# Patient Record
Sex: Male | Born: 1960 | Race: White | Hispanic: No | Marital: Married | Smoking: Current every day smoker
Health system: Southern US, Community
[De-identification: ages and names within clinical notes are randomized; demographics above are authoritative.]

## PROBLEM LIST (undated history)

## (undated) DIAGNOSIS — L309 Dermatitis, unspecified: Secondary | ICD-10-CM

## (undated) DIAGNOSIS — I10 Essential (primary) hypertension: Secondary | ICD-10-CM

## (undated) DIAGNOSIS — J438 Other emphysema: Secondary | ICD-10-CM

## (undated) HISTORY — DX: Essential (primary) hypertension: I10

## (undated) HISTORY — PX: KNEE SURGERY: SHX244

---

## 2013-05-21 ENCOUNTER — Ambulatory Visit: Payer: Self-pay | Admitting: Family Medicine

## 2014-12-23 ENCOUNTER — Encounter: Payer: Self-pay | Admitting: Emergency Medicine

## 2014-12-23 ENCOUNTER — Ambulatory Visit
Admission: EM | Admit: 2014-12-23 | Discharge: 2014-12-23 | Disposition: A | Discharge status: Home / Self Care | Payer: Self-pay | Attending: Family Medicine | Admitting: Family Medicine

## 2014-12-23 DIAGNOSIS — M791 Myalgia, unspecified site: Secondary | ICD-10-CM

## 2014-12-23 DIAGNOSIS — B349 Viral infection, unspecified: Secondary | ICD-10-CM

## 2014-12-23 DIAGNOSIS — F17210 Nicotine dependence, cigarettes, uncomplicated: Secondary | ICD-10-CM | POA: Insufficient documentation

## 2014-12-23 LAB — CBC WITH DIFFERENTIAL/PLATELET
Basophils Absolute: 0.2 10*3/uL — ABNORMAL HIGH (ref 0–0.1)
Basophils Relative: 1 %
EOS ABS: 0.2 10*3/uL (ref 0–0.7)
Eosinophils Relative: 1 %
HCT: 47.5 % (ref 40.0–52.0)
HEMOGLOBIN: 16.2 g/dL (ref 13.0–18.0)
LYMPHS ABS: 1.7 10*3/uL (ref 1.0–3.6)
Lymphocytes Relative: 15 %
MCH: 30 pg (ref 26.0–34.0)
MCHC: 34.1 g/dL (ref 32.0–36.0)
MCV: 87.9 fL (ref 80.0–100.0)
Monocytes Absolute: 1.4 10*3/uL — ABNORMAL HIGH (ref 0.2–1.0)
Monocytes Relative: 12 %
NEUTROS PCT: 71 %
Neutro Abs: 8 10*3/uL — ABNORMAL HIGH (ref 1.4–6.5)
Platelets: 250 10*3/uL (ref 150–440)
RBC: 5.4 MIL/uL (ref 4.40–5.90)
RDW: 14.7 % — AB (ref 11.5–14.5)
WBC: 11.3 10*3/uL — ABNORMAL HIGH (ref 3.8–10.6)

## 2014-12-23 LAB — URINALYSIS COMPLETE WITH MICROSCOPIC (ARMC ONLY)
Bacteria, UA: NONE SEEN — AB
Glucose, UA: NEGATIVE mg/dL
Ketones, ur: NEGATIVE mg/dL
Leukocytes, UA: NEGATIVE
Nitrite: NEGATIVE
PH: 6 (ref 5.0–8.0)
PROTEIN: 100 mg/dL — AB
SPECIFIC GRAVITY, URINE: 1.025 (ref 1.005–1.030)
SQUAMOUS EPITHELIAL / LPF: NONE SEEN — AB

## 2014-12-23 LAB — COMPREHENSIVE METABOLIC PANEL
ALBUMIN: 4 g/dL (ref 3.5–5.0)
ALT: 13 U/L — ABNORMAL LOW (ref 17–63)
ANION GAP: 10 (ref 5–15)
AST: 20 U/L (ref 15–41)
Alkaline Phosphatase: 82 U/L (ref 38–126)
BUN: 11 mg/dL (ref 6–20)
CHLORIDE: 99 mmol/L — AB (ref 101–111)
CO2: 29 mmol/L (ref 22–32)
CREATININE: 1.01 mg/dL (ref 0.61–1.24)
Calcium: 9.4 mg/dL (ref 8.9–10.3)
GFR calc Af Amer: >60 mL/min (ref >60)
GFR calc non Af Amer: >60 mL/min (ref >60)
GLUCOSE: 119 mg/dL — AB (ref 65–99)
Potassium: 3.6 mmol/L (ref 3.5–5.1)
Sodium: 138 mmol/L (ref 135–145)
Total Bilirubin: 0.4 mg/dL (ref 0.3–1.2)
Total Protein: 8 g/dL (ref 6.5–8.1)

## 2014-12-23 LAB — CK: Total CK: 94 U/L (ref 49–397)

## 2014-12-23 NOTE — Discharge Instructions (Signed)

## 2014-12-23 NOTE — ED Provider Notes (Signed)
CSN: [id number]     Arrival date & time 12/23/14  1320 History   None    Chief Complaint  Patient presents with  . Cough   (Consider location/radiation/quality/duration/timing/severity/associated sxs/prior Treatment) HPI 54 year old male presents for evaluation of severe body aches, chills, urinary frequency, burning with urination. His symptoms first started 4 days ago with acute onset of chills and weakness. He had an episode of near syncope that resolved with rest. Since then he has had body aches all over. He has not been drinking enough water he doesn't think, last night he drank a large amount of water and today he feels slightly better. He denies any flank or abdominal pain, nausea, vomiting, chest pain, shortness of breath, leg swelling. He has never had anything like this in the past. Denies any measured fever. Says today he does actually feel like he is starting to get better but he just was to be evaluated to make sure everything is fine. He denies any rash. He is a current smoker, smokes about half pack a day.  History reviewed. No pertinent past medical history. Past Surgical History  Procedure Laterality Date  . Knee surgery Bilateral    History reviewed. No pertinent family history. History  Substance Use Topics  . Smoking status: Current Every Day Smoker -- 0.50 packs/day    Types: Cigarettes  . Smokeless tobacco: Never Used  . Alcohol Use: No    Review of Systems  Constitutional: Positive for chills and fatigue. Negative for fever.  Respiratory: Positive for cough (very mild). Negative for shortness of breath.   Cardiovascular: Negative for chest pain.  Gastrointestinal: Negative for nausea, vomiting, abdominal pain and diarrhea.  Genitourinary: Positive for dysuria, urgency and frequency. Negative for hematuria and discharge.  Musculoskeletal: Positive for myalgias.  Neurological: Positive for weakness.  All other systems reviewed and are negative.   Allergies   Penicillins  Home Medications   Prior to Admission medications   Not on File   BP 156/92 mmHg  Pulse 100  Temp(Src) 98.4 F (36.9 C) (Tympanic)  Resp 16  Ht 5\' 9"  (1.753 m)  Wt 145 lb (65.772 kg)  BMI 21.40 kg/m2  SpO2 98% Physical Exam  Constitutional: He is oriented to person, place, and time. He appears well-developed and well-nourished. No distress.  HENT:  Head: Normocephalic and atraumatic.  Right Ear: Tympanic membrane, external ear and ear canal normal.  Left Ear: Tympanic membrane, external ear and ear canal normal.  Nose: Nose normal. Right sinus exhibits no maxillary sinus tenderness and no frontal sinus tenderness. Left sinus exhibits no maxillary sinus tenderness and no frontal sinus tenderness.  Mouth/Throat: Uvula is midline, oropharynx is clear and moist and mucous membranes are normal. No oropharyngeal exudate or posterior oropharyngeal erythema.  Eyes: Conjunctivae are normal.  Neck: Normal range of motion and full passive range of motion without pain. Neck supple.  Cardiovascular: Normal rate, regular rhythm, normal heart sounds and intact distal pulses.   Pulmonary/Chest: Effort normal and breath sounds normal. No respiratory distress.  Abdominal: Soft. Normal appearance and bowel sounds are normal. He exhibits no mass. There is no hepatosplenomegaly. There is no tenderness. There is no rigidity, no rebound, no guarding, no CVA tenderness, no tenderness at McBurney's point and negative Murphy's sign.  Lymphadenopathy:    He has no cervical adenopathy.  Neurological: He is alert and oriented to person, place, and time. Coordination normal.  Skin: Skin is warm and dry. No rash noted. He is not diaphoretic.  Psychiatric: He has a normal mood and affect. Judgment normal.  Nursing note and vitals reviewed.   ED Course  Procedures (including critical care time) Labs Review Labs Reviewed  URINALYSIS COMPLETEWITH MICROSCOPIC ([facility] ONLY) - Abnormal; Notable for  the following:    Color, Urine AMBER (*)    Bilirubin Urine 1+ (*)    Hgb urine dipstick 2+ (*)    Protein, ur 100 (*)    Bacteria, UA NONE SEEN (*)    Squamous Epithelial / LPF NONE SEEN (*)    All other components within normal limits  CBC WITH DIFFERENTIAL/PLATELET - Abnormal; Notable for the following:    WBC 11.3 (*)    RDW 14.7 (*)    Neutro Abs 8.0 (*)    Monocytes Absolute 1.4 (*)    Basophils Absolute 0.2 (*)    All other components within normal limits  COMPREHENSIVE METABOLIC PANEL - Abnormal; Notable for the following:    Chloride 99 (*)    Glucose, Bld 119 (*)    ALT 13 (*)    All other components within normal limits  CK    Imaging Review No results found.   MDM   1. Viral syndrome   2. Myalgia    Mild leukocytosis, rest of labs unremarkable. CK is not elevated so no concern for rhabdo. He has some blood in the urine, he will follow-up with his primary care provider about this. I believe he most likely has a viral infection, especially considering that he is improving today. Advised him to continue symptomatic management and follow-up again if any worsening.      Graylon Good, PA-C 12/23/14 1617  Graylon Good, PA-C 12/23/14 [phone]

## 2014-12-23 NOTE — ED Notes (Signed)
Patient c/o cough and chest congestion since Saturday.  Patient reports chills.

## 2014-12-31 ENCOUNTER — Ambulatory Visit
Admission: EM | Admit: 2014-12-31 | Discharge: 2014-12-31 | Disposition: A | Discharge status: Home / Self Care | Payer: Self-pay | Attending: Family Medicine | Admitting: Family Medicine

## 2014-12-31 ENCOUNTER — Ambulatory Visit: Payer: Self-pay

## 2014-12-31 DIAGNOSIS — L309 Dermatitis, unspecified: Secondary | ICD-10-CM | POA: Insufficient documentation

## 2014-12-31 DIAGNOSIS — F17210 Nicotine dependence, cigarettes, uncomplicated: Secondary | ICD-10-CM | POA: Insufficient documentation

## 2014-12-31 DIAGNOSIS — J189 Pneumonia, unspecified organism: Secondary | ICD-10-CM | POA: Insufficient documentation

## 2014-12-31 LAB — URINALYSIS COMPLETE WITH MICROSCOPIC (ARMC ONLY)
BACTERIA UA: NONE SEEN — AB
BILIRUBIN URINE: NEGATIVE
GLUCOSE, UA: NEGATIVE mg/dL
Ketones, ur: NEGATIVE mg/dL
Leukocytes, UA: NEGATIVE
Nitrite: NEGATIVE
PH: 6.5 (ref 5.0–8.0)
PROTEIN: NEGATIVE mg/dL
Specific Gravity, Urine: 1.02 (ref 1.005–1.030)
Squamous Epithelial / LPF: NONE SEEN — AB

## 2014-12-31 LAB — RAPID INFLUENZA A&B ANTIGENS
Influenza A (ARMC): NOT DETECTED
Influenza B (ARMC): NOT DETECTED

## 2014-12-31 LAB — RAPID STREP SCREEN (MED CTR MEBANE ONLY): Streptococcus, Group A Screen (Direct): NEGATIVE

## 2014-12-31 MED ORDER — AZITHROMYCIN 250 MG PO TABS: 250.0000 mg | ORAL_TABLET | Freq: Every day | ORAL | Status: DC

## 2014-12-31 MED ORDER — IPRATROPIUM-ALBUTEROL 0.5-2.5 (3) MG/3ML IN SOLN
3.0000 mL | Freq: Once | RESPIRATORY_TRACT | Status: AC
  Administered 2014-12-31: 3 mL via RESPIRATORY_TRACT

## 2014-12-31 NOTE — ED Provider Notes (Addendum)
CSN: [id number]     Arrival date & time 12/31/14  1153 History   First MD Initiated Contact with Patient 12/31/14 1229     Chief Complaint  Patient presents with  . Nasal Congestion  . 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  Stayed in bed until seen by PA Baker.  Tried to go back to work 6 Jun but still having fatigue carrying tools to load truck, headache, chest congestion, cough, neck/upper back pain/cramps, rash on back "I think from sweating so much"  Cough mucuous has changed from yellow to thick white and clear  Denied sick contacts.  Urinating small frequent amounts tolerating po intake.  Normal blood pressure 120-130s over 60-70s  Patient is a 54 y.o. male presenting with rash. The history is provided by the patient.  Rash Location:  Torso Torso rash location:  Upper back and lower back Quality: itchiness   Quality: not blistering, not bruising, not burning, not draining, not dry, not painful, not peeling, not red, not scaling, not swelling and not weeping   Severity:  Moderate Onset quality:  Sudden Duration:  5 days Timing:  Constant Progression:  Unchanged Chronicity:  New Context: sun exposure   Context: not animal contact, not chemical exposure, not diapers, not eggs, not exposure to similar rash, not food, not hot tub use, not insect bite/sting, not medications, not new detergent/soap, not nuts, not plant contact, not pollen, not pregnancy and not sick contacts   Relieved by:  Nothing Worsened by:  Heat and moisture Ineffective treatments:  None tried Associated symptoms: fatigue, headaches, myalgias and URI   Associated symptoms: no abdominal pain, no diarrhea, no fever, no hoarse voice, no induration, no joint pain, no nausea, no periorbital  edema, no shortness of breath, no sore throat, no throat swelling, no tongue swelling, not vomiting and not wheezing   Fatigue:    Severity:  Moderate   Duration:  10 days   Timing:  Constant   Progression:  Improving Headaches:    Severity:  Moderate   Onset quality:  Gradual   Duration:  5 days   Timing:  Constant   Progression:  Unchanged   Chronicity:  New Myalgias:    Location:  Generalized   Quality:  Aching   Severity:  Moderate   Onset quality:  Sudden   Duration:  10 days   Timing:  Constant   Progression:  Improving   History reviewed. No pertinent past medical history. Past Surgical History  Procedure Laterality Date  . Knee surgery Bilateral   . Knee surgery      bilateral   Family History  Problem Relation Age of Onset  . Parkinson's disease Mother   . Cancer Father   . Cancer Sister   . Lupus Sister    History  Substance Use Topics  . Smoking status: Current Every Day Smoker -- 0.50 packs/day    Types: Cigarettes  . Smokeless tobacco: Never Used  . Alcohol Use: No    Review of Systems  Constitutional: Positive for chills, diaphoresis, activity change, appetite change and fatigue. Negative for fever.  HENT: Negative for congestion, dental problem, drooling, ear discharge, ear pain, facial swelling, hearing loss, hoarse voice, mouth sores, nosebleeds, postnasal drip, rhinorrhea, sinus pressure, sneezing, sore throat, tinnitus, trouble swallowing and voice change.   Eyes: Negative for  photophobia, pain, discharge, redness, itching and visual disturbance.  Respiratory: Positive for cough. Negative for apnea, choking, chest tightness, shortness of breath, wheezing and stridor.   Cardiovascular: Negative for chest pain, palpitations and leg swelling.  Gastrointestinal: Negative for nausea, vomiting, abdominal pain, diarrhea, constipation, blood in stool and abdominal distention.  Endocrine: Negative for cold intolerance and heat intolerance.   Genitourinary: Positive for frequency and decreased urine volume. Negative for dysuria, urgency, hematuria, flank pain, scrotal swelling, enuresis, difficulty urinating, genital sores and testicular pain.  Musculoskeletal: Positive for myalgias, back pain and neck pain. Negative for joint swelling, arthralgias, gait problem and neck stiffness.  Skin: Positive for rash. Negative for color change, pallor and wound.  Allergic/Immunologic: Negative for environmental allergies and food allergies.  Neurological: Positive for weakness and headaches. Negative for dizziness, tremors, seizures, syncope, facial asymmetry, speech difficulty, light-headedness and numbness.  Hematological: Negative for adenopathy. Does not bruise/bleed easily.  Psychiatric/Behavioral: Negative for behavioral problems, confusion, sleep disturbance and agitation.    Allergies  Codeine and Penicillins  Home Medications   Prior to Admission medications   Medication Sig Start Date End Date Taking? Authorizing Provider  azithromycin (ZITHROMAX) 250 MG tablet Take 1 tablet (250 mg total) by mouth daily. Take first 2 tablets together, then 1 every day until finished. 12/31/14   Jarold Song Betancourt, NP   BP 155/102 mmHg  Pulse 84  Temp(Src) 98 F (36.7 C) (Oral)  Resp 18  Ht 5\' 9"  (1.753 m)  Wt 140 lb (63.504 kg)  BMI 20.67 kg/m2  SpO2 98% Physical Exam  Constitutional: He is oriented to person, place, and time. He appears well-developed and well-nourished. He is active. No distress.  HENT:  Head: Normocephalic and atraumatic.  Right Ear: Hearing, external ear and ear canal normal. A middle ear effusion is present.  Left Ear: Hearing, external ear and ear canal normal. A middle ear effusion is present.  Nose: Nose normal. No mucosal edema, rhinorrhea, nose lacerations, sinus tenderness, nasal deformity, septal deviation or nasal septal hematoma. No epistaxis.  No foreign bodies. Right sinus exhibits no maxillary sinus  tenderness and no frontal sinus tenderness. Left sinus exhibits no maxillary sinus tenderness and no frontal sinus tenderness.  Mouth/Throat: Uvula is midline and mucous membranes are normal. He does not have dentures. No oral lesions. No trismus in the jaw. Normal dentition. No dental abscesses, uvula swelling, lacerations or dental caries. Posterior oropharyngeal edema and posterior oropharyngeal erythema present. No oropharyngeal exudate or tonsillar abscesses.  Oropharynx erythematous tonsils 1+/4 bilateral erythema/edema; bilateral TMs with effusion clear  Eyes: Conjunctivae and EOM are normal. Pupils are equal, round, and reactive to light. Right eye exhibits no discharge. Left eye exhibits no discharge. No scleral icterus.  Neck: Normal range of motion. Neck supple. No JVD present. No tracheal deviation present. No thyromegaly present.  Cardiovascular: Normal rate, regular rhythm, normal heart sounds and intact distal pulses.  Exam reveals no gallop and no friction rub.   No murmur heard. Pulmonary/Chest: Effort normal and breath sounds normal. No stridor. No respiratory distress. He has no wheezes. He has no rales. He exhibits no tenderness.  Abdominal: Soft. He exhibits no distension.  Musculoskeletal: Normal range of motion. He exhibits no edema.       Cervical back: He exhibits tenderness, pain and spasm. He exhibits normal range of motion, no bony tenderness, no swelling, no edema, no deformity, no laceration and normal pulse.  Bilateral trapezius muscles TTP tense  Lymphadenopathy:    He has  no cervical adenopathy.  Neurological: He is alert and oriented to person, place, and time. He has normal reflexes. Coordination normal.  Skin: Skin is warm and dry. Rash noted. He is not diaphoretic. No erythema. No pallor.  Psychiatric: He has a normal mood and affect. His behavior is normal. Judgment and thought content normal.  Nursing note and vitals reviewed.   ED Course  Procedures  (including critical care time) Labs Review Labs Reviewed  URINALYSIS COMPLETEWITH MICROSCOPIC ([facility] ONLY) - Abnormal; Notable for the following:    Hgb urine dipstick 1+ (*)    Bacteria, UA NONE SEEN (*)    Squamous Epithelial / LPF NONE SEEN (*)    All other components within normal limits  RAPID STREP SCREEN (NOT AT [facility])  INFLUENZA A&B ANTIGENS ([facility] ONLY)  CULTURE, GROUP A STREP ([facility] ONLY)    Imaging Review Dg Chest 2 View  12/31/2014   CLINICAL DATA:  54 year old male with productive cough, yellow sputum for 2 weeks. Back pain and weakness. Smoker. Initial encounter.  EXAM: CHEST  2 VIEW  COMPARISON:  None.  FINDINGS: Large lung volumes. Subtle patchy and asymmetric opacity projecting in the superior segment of the right lower lobe (arrows). No superimposed pneumothorax, pulmonary edema, pleural effusion, or consolidation. Normal cardiac size and mediastinal contours. Visualized tracheal air column is within normal limits. No acute osseous abnormality identified.  IMPRESSION: Patchy and indistinct opacity in the superior segment of the right lower lobe. Followup PA and lateral chest X-ray is recommended in 3-4 weeks following trial of antibiotic therapy to ensure resolution and exclude underlying malignancy.   Electronically Signed   By: Odessa Fleming M.D.   On: 12/31/2014 13:22   Patient reported headache improved after duoneb; no change in breathing effort.  Sp02 98% room air. Discussed chest xray results, rapid strep and flu negative.  Urinalysis improved from previous but still protein continue hydration, rest.  Work excuse x 48 hours.  Patient given copy of xray/lab results. Discussed with patient and RN Lynne-Anne to repeat BP prior to discharge.   Patient verbalized understanding of information/instructions, agreed with plan of care and had no further questions.    MDM   1. Community acquired pneumonia   2. Dermatitis   repeat chest xray in 4 weeks for patchy asymetric opacity right  lower lobe.  Already ordered.  Pneumonia community acquired simple, community acquired, may have started as viral (probably respiratory syncytial, parainfluenza, influenza, or adenovirus), but now evidence of patchy opacity right lower lobe.  Azithromycin  po daily x 1 day then  po daily x 4 days.  Stop smoking.   Differential Diagnoses:  Reactive Airway Disease (asthma, allergic aspergillosis eosinophilia), chronic bronchitis, respiratory infection (sinusitis, common cold, pneumonia), congestive heart failure, smoke/irritant exposure, reflux esophagitis, bronchogenic tumor, and/or aspiration syndromes.  I will give   I discussed that approximately 50% of patients with acute bronchitis have a cough that lasts up to three weeks, and 25% for over a month. Tylenol, one to two tablets every four hours as needed for fever or myalgias. Patient instructed to follow up in one week with PCM or sooner if symptoms worsen. Patient verbalized agreement and understanding of treatment plan.  P2:  hand washing and cover cough  Symptomatic therapy suggested benadryl or zyrtec OTC po prn.  Warm to cool water soaks and/or oatmeal baths.  Call or return to clinic as needed if these symptoms worsen or fail to improve as anticipated.  Exitcare handout on dermatitis given  to patient.  Patient verbalized agreement and understanding of treatment plan.   P2:  Avoidance and hand washing.     Barbaraann Barthelina A Betancourt, NP 12/31/14 1355  09 Jan 2015 at 0838 Telephone message left for patient throat culture results negative/normal.  Patient to contact clinic to verify message receipt and if any improvement in symptoms.  Patient still has follow up chest xray pending first week of July.  Barbaraann Barthelina A Betancourt, NP 01/09/15 [phone]  Telephone message left for patient to contact clinic to verify he had completed follow up chest xray s/p pneumonia and if symptoms have resolved or cough continues.  10 Feb 2015 at 1323  Barbaraann Barthelina A Betancourt,  NP 02/10/15 1323

## 2014-12-31 NOTE — Discharge Instructions (Signed)
Pneumonia °Pneumonia is an infection of the lungs.  °CAUSES °Pneumonia may be caused by bacteria or a virus. Usually, these infections are caused by breathing infectious particles into the lungs (respiratory tract). °SIGNS AND SYMPTOMS  °· Cough. °· Fever. °· Chest pain. °· Increased rate of breathing. °· Wheezing. °· Mucus production. °DIAGNOSIS  °If you have the common symptoms of pneumonia, your health care provider will typically confirm the diagnosis with a chest X-ray. The X-ray will show an abnormality in the lung (pulmonary infiltrate) if you have pneumonia. Other tests of your blood, urine, or sputum may be done to find the specific cause of your pneumonia. Your health care provider may also do tests (blood gases or pulse oximetry) to see how well your lungs are working. °TREATMENT  °Some forms of pneumonia may be spread to other people when you cough or sneeze. You may be asked to wear a mask before and during your exam. Pneumonia that is caused by bacteria is treated with antibiotic medicine. Pneumonia that is caused by the influenza virus may be treated with an antiviral medicine. Most other viral infections must run their course. These infections will not respond to antibiotics.  °HOME CARE INSTRUCTIONS  °· Cough suppressants may be used if you are losing too much rest. However, coughing protects you by clearing your lungs. You should avoid using cough suppressants if you can. °· Your health care provider may have prescribed medicine if he or she thinks your pneumonia is caused by bacteria or influenza. Finish your medicine even if you start to feel better. °· Your health care provider may also prescribe an expectorant. This loosens the mucus to be coughed up. °· Take medicines only as directed by your health care provider. °· Do not smoke. Smoking is a common cause of bronchitis and can contribute to pneumonia. If you are a smoker and continue to smoke, your cough may last several weeks after your  pneumonia has cleared. °· A cold steam vaporizer or humidifier in your room or home may help loosen mucus. °· Coughing is often worse at night. Sleeping in a semi-upright position in a recliner or using a couple pillows under your head will help with this. °· Get rest as you feel it is needed. Your body will usually let you know when you need to rest. °PREVENTION °A pneumococcal shot (vaccine) is available to prevent a common bacterial cause of pneumonia. This is usually suggested for: °· People over 65 years old. °· Patients on chemotherapy. °· People with chronic lung problems, such as bronchitis or emphysema. °· People with immune system problems. °If you are over 65 or have a high risk condition, you may receive the pneumococcal vaccine if you have not received it before. In some countries, a routine influenza vaccine is also recommended. This vaccine can help prevent some cases of pneumonia. You may be offered the influenza vaccine as part of your care. °If you smoke, it is time to quit. You may receive instructions on how to stop smoking. Your health care provider can provide medicines and counseling to help you quit. °SEEK MEDICAL CARE IF: °You have a fever. °SEEK IMMEDIATE MEDICAL CARE IF:  °· Your illness becomes worse. This is especially true if you are elderly or weakened from any other disease. °· You cannot control your cough with suppressants and are losing sleep. °· You begin coughing up blood. °· You develop pain which is getting worse or is uncontrolled with medicines. °· Any of the symptoms   which initially brought you in for treatment are getting worse rather than better.  You develop shortness of breath or chest pain. MAKE SURE YOU:   Understand these instructions.  Will watch your condition.  Will get help right away if you are not doing well or get worse. Document Released: 07/11/2005 Document Revised: 11/25/2013 Document Reviewed: 09/30/2010 [facility] Patient Information 2015  [place]. This information is not intended to replace advice given to you by your health care provider. Make sure you discuss any questions you have with your health care provider. Contact Dermatitis Contact dermatitis is a reaction to certain substances that touch the skin. Contact dermatitis can be either irritant contact dermatitis or allergic contact dermatitis. Irritant contact dermatitis does not require previous exposure to the substance for a reaction to occur.Allergic contact dermatitis only occurs if you have been exposed to the substance before. Upon a repeat exposure, your body reacts to the substance.  CAUSES  Many substances can cause contact dermatitis. Irritant dermatitis is most commonly caused by repeated exposure to mildly irritating substances, such as:  Makeup.  Soaps.  Detergents.  Bleaches.  Acids.  Metal salts, such as nickel. Allergic contact dermatitis is most commonly caused by exposure to:  Poisonous plants.  Chemicals (deodorants, shampoos).  Jewelry.  Latex.  Neomycin in triple antibiotic cream.  Preservatives in products, including clothing. SYMPTOMS  The area of skin that is exposed may develop:  Dryness or flaking.  Redness.  Cracks.  Itching.  Pain or a burning sensation.  Blisters. With allergic contact dermatitis, there may also be swelling in areas such as the eyelids, mouth, or genitals.  DIAGNOSIS  Your caregiver can usually tell what the problem is by doing a physical exam. In cases where the cause is uncertain and an allergic contact dermatitis is suspected, a patch skin test may be performed to help determine the cause of your dermatitis. TREATMENT Treatment includes protecting the skin from further contact with the irritating substance by avoiding that substance if possible. Barrier creams, powders, and gloves may be helpful. Your caregiver may also recommend:  Steroid creams or ointments applied 2 times daily. For  best results, soak the rash area in cool water for 20 minutes. Then apply the medicine. Cover the area with a plastic wrap. You can store the steroid cream in the refrigerator for a "chilly" effect on your rash. That may decrease itching. Oral steroid medicines may be needed in more severe cases.  Antibiotics or antibacterial ointments if a skin infection is present.  Antihistamine lotion or an antihistamine taken by mouth to ease itching.  Lubricants to keep moisture in your skin.  Burow's solution to reduce redness and soreness or to dry a weeping rash. Mix one packet or tablet of solution in 2 cups cool water. Dip a clean washcloth in the mixture, wring it out a bit, and put it on the affected area. Leave the cloth in place for 30 minutes. Do this as often as possible throughout the day.  Taking several cornstarch or baking soda baths daily if the area is too large to cover with a washcloth. Harsh chemicals, such as alkalis or acids, can cause skin damage that is like a burn. You should flush your skin for 15 to 20 minutes with cold water after such an exposure. You should also seek immediate medical care after exposure. Bandages (dressings), antibiotics, and pain medicine may be needed for severely irritated skin.  HOME CARE INSTRUCTIONS  Avoid the substance that caused  your reaction.  Keep the area of skin that is affected away from hot water, soap, sunlight, chemicals, acidic substances, or anything else that would irritate your skin.  Do not scratch the rash. Scratching may cause the rash to become infected.  You may take cool baths to help stop the itching.  Only take over-the-counter or prescription medicines as directed by your caregiver.  See your caregiver for follow-up care as directed to make sure your skin is healing properly. SEEK MEDICAL CARE IF:   Your condition is not better after 3 days of treatment.  You seem to be getting worse.  You see signs of infection such as  swelling, tenderness, redness, soreness, or warmth in the affected area.  You have any problems related to your medicines. Document Released: 07/08/2000 Document Revised: 10/03/2011 Document Reviewed: 12/14/2010 [facility] Patient Information 2015 [place], [place]. This information is not intended to replace advice given to you by your health care provider. Make sure you discuss any questions you have with your health care provider.

## 2014-12-31 NOTE — ED Notes (Signed)
Pt seen here last Wednesday. Continues to feel poorly and complains of congestion and bumps on his back.

## 2015-01-03 LAB — CULTURE, GROUP A STREP (THRC)

## 2015-01-06 ENCOUNTER — Ambulatory Visit
Admission: RE | Admit: 2015-01-06 | Discharge: 2015-01-06 | Disposition: A | Discharge status: Home / Self Care | Payer: Self-pay | Source: Ambulatory Visit | Attending: Registered Nurse | Admitting: Registered Nurse

## 2015-02-20 ENCOUNTER — Encounter: Payer: Self-pay | Admitting: Registered Nurse

## 2015-02-20 ENCOUNTER — Telehealth: Payer: Self-pay | Admitting: Registered Nurse

## 2015-02-20 MED ORDER — AZITHROMYCIN 250 MG PO TABS: 250.0000 mg | ORAL_TABLET | Freq: Every day | ORAL | Status: DC

## 2015-02-20 NOTE — Telephone Encounter (Signed)
Patient contacted clinic for refill of azithromycin.  Patient reported his symptoms resolved after taking medications as prescribed for his visit 30 Jan 2015 x 3 weeks but started to have sharp back pain, stiff shoulders and neck and congestion x 2 days.  Denied shortness of breath, wheezing, hemoptysis, fever, chills.  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  Discussed with patient I had left message 10 Feb 2015 for him to contact clinic to follow up abnormal chest xray as radiologist recommended follow up chest xray in 3 weeks after last appt and he had been noncompliant.  Patient reported symptoms had resolved and unable to take off time from work.  He agreed to come in on 5 Aug at 0800 for repeat chest xray and follow up with me.  Patient to follow up sooner if dyspnea, hemoptysis, wheezing.  Encouraged patient to stop smoking. Patient did not want to stop at this time.   Hydrate.  Patient verbalized understanding of information/instructions, agreed with plan of care and had no further questions at this time.

## 2015-02-27 ENCOUNTER — Encounter: Payer: Self-pay | Admitting: Emergency Medicine

## 2015-02-27 ENCOUNTER — Ambulatory Visit: Payer: Self-pay

## 2015-02-27 ENCOUNTER — Ambulatory Visit
Admission: EM | Admit: 2015-02-27 | Discharge: 2015-02-27 | Disposition: A | Discharge status: Home / Self Care | Payer: Self-pay | Attending: Family Medicine | Admitting: Family Medicine

## 2015-02-27 DIAGNOSIS — R053 Chronic cough: Secondary | ICD-10-CM

## 2015-02-27 DIAGNOSIS — R05 Cough: Secondary | ICD-10-CM | POA: Insufficient documentation

## 2015-02-27 DIAGNOSIS — F17210 Nicotine dependence, cigarettes, uncomplicated: Secondary | ICD-10-CM | POA: Insufficient documentation

## 2015-02-27 NOTE — ED Provider Notes (Signed)
CSN: [id number]     Arrival date & time 02/27/15  0826 History   First MD Initiated Contact with Patient 02/27/15 [phone]     Chief Complaint  Patient presents with  . Cough   (Consider location/radiation/quality/duration/timing/severity/associated sxs/prior Treatment) HPI Comments: Caucasian male here for follow up had viral illness possible rhabdomyolysis  31 May seen by PA Baker started 4 days prior.  Was then seen by me 31 Dec 2014 diagnosed with pneumonia and abnormal chest xray treated with azithromycin and symptoms resolved but then returned 29 Jul and retreated with azithromycin.  Patient here for follow up chest xray and re-evaluation.  Reported smoking 15 cigarettes per day versus 1 1/2 PPD previously.  Still having intermittent smokers cough and wheezing per patient his normal now.  Upper back  Pain right has resolved that had started last week  With worsening cough.  Per patient normal blood pressure 120-130s over 60-70s anxious today as sister with renal cell carcinoma and father with aortic aneurysm/dissection/cancer mets renal, gastric, heart, lungs, smoker died 2 years after diagnosis   Patient is a 54 y.o. male presenting with cough. The history is provided by the patient.  Cough Cough characteristics:  Non-productive Severity:  Moderate Onset quality:  Sudden Timing:  Intermittent Progression:  Improving Chronicity:  Recurrent Smoker: yes   Context: animal exposure, exposure to allergens, smoke exposure, upper respiratory infection, weather changes and with activity   Context: not fumes, not occupational exposure and not sick contacts   Worsened by:  Smoking Associated symptoms: wheezing   Associated symptoms: no chest pain, no chills, no diaphoresis, no ear fullness, no ear pain, no eye discharge, no fever, no headaches, no myalgias, no rash, no rhinorrhea, no shortness of breath, no sinus congestion, no sore throat and no weight loss   Wheezing:    Severity:  Mild   Onset  quality:  Gradual   Timing:  Intermittent   Progression:  Unchanged   Chronicity:  Chronic Risk factors: recent infection   Risk factors: no chemical exposure and no recent travel     History reviewed. No pertinent past medical history. Past Surgical History  Procedure Laterality Date  . Knee surgery Bilateral   . Knee surgery      bilateral   Family History  Problem Relation Age of Onset  . Parkinson's disease Mother   . Cancer Father   . Cancer Sister   . Lupus Sister    History  Substance Use Topics  . Smoking status: Current Every Day Smoker -- 0.50 packs/day    Types: Cigarettes  . Smokeless tobacco: Never Used  . Alcohol Use: No    Review of Systems  Constitutional: Negative for fever, chills, weight loss, diaphoresis, activity change and appetite change.  HENT: Negative for congestion, dental problem, drooling, ear discharge, ear pain, facial swelling, hearing loss, mouth sores, nosebleeds, postnasal drip, rhinorrhea, sinus pressure, sneezing, sore throat, tinnitus, trouble swallowing and voice change.   Eyes: Negative for photophobia, pain, discharge, redness, itching and visual disturbance.  Respiratory: Positive for cough and wheezing. Negative for choking, chest tightness, shortness of breath and stridor.   Cardiovascular: Negative for chest pain, palpitations and leg swelling.  Gastrointestinal: Negative for nausea, vomiting, abdominal pain, diarrhea, constipation, blood in stool and abdominal distention.  Endocrine: Negative for cold intolerance and heat intolerance.  Genitourinary: Negative for dysuria, frequency, hematuria, flank pain and difficulty urinating.  Musculoskeletal: Negative for myalgias, back pain, joint swelling, arthralgias, gait problem, neck pain and neck  stiffness.  Skin: Negative for color change, pallor, rash and wound.  Allergic/Immunologic: Negative for environmental allergies and food allergies.  Neurological: Negative for dizziness,  tremors, seizures, syncope, facial asymmetry, speech difficulty, weakness, light-headedness, numbness and headaches.  Hematological: Negative for adenopathy. Does not bruise/bleed easily.  Psychiatric/Behavioral: Negative for behavioral problems, confusion, sleep disturbance and agitation. The patient is nervous/anxious.     Allergies  Codeine and Penicillins  Home Medications   Prior to Admission medications   Medication Sig Start Date End Date Taking? Authorizing Provider  azithromycin (ZITHROMAX) 250 MG tablet Take 1 tablet (250 mg total) by mouth daily. Take first 2 tablets together, then 1 every day until finished. Patient not taking: Reported on 02/20/2015 12/31/14   Jarold Song Dannika Hilgeman, NP   BP 177/95 mmHg  Pulse 70  Temp(Src) 96.7 F (35.9 C) (Tympanic)  Resp 16  Ht  (1.753 m)  Wt 140 lb (63.504 kg)  BMI 20.67 kg/m2  SpO2 99% Physical Exam  Constitutional: He is oriented to person, place, and time. Vital signs are normal. He appears well-developed and well-nourished. No distress.  HENT:  Head: Normocephalic and atraumatic.  Right Ear: Hearing, external ear and ear canal normal. A middle ear effusion is present.  Left Ear: Hearing, external ear and ear canal normal. A middle ear effusion is present.  Nose: Nose normal. No mucosal edema or rhinorrhea. Right sinus exhibits no maxillary sinus tenderness and no frontal sinus tenderness. Left sinus exhibits no maxillary sinus tenderness and no frontal sinus tenderness.  Mouth/Throat: Uvula is midline and mucous membranes are normal. Mucous membranes are not pale, not dry and not cyanotic. He does not have dentures. No oral lesions. No trismus in the jaw. Normal dentition. No dental abscesses, uvula swelling, lacerations or dental caries. Posterior oropharyngeal edema and posterior oropharyngeal erythema present. No oropharyngeal exudate or tonsillar abscesses.  Cobblestoning posterior pharynx; bilateral TMs with air fluid level  clear  Eyes: Conjunctivae, EOM and lids are normal. Pupils are equal, round, and reactive to light. Right eye exhibits no discharge. Left eye exhibits no discharge. No scleral icterus.  Neck: Trachea normal and normal range of motion. Neck supple. No tracheal deviation present.  Cardiovascular: Normal rate, regular rhythm, normal heart sounds and intact distal pulses.  Exam reveals no gallop and no friction rub.   No murmur heard. Pulmonary/Chest: Effort normal and breath sounds normal. No accessory muscle usage or stridor. No respiratory distress. He has no decreased breath sounds. He has no wheezes. He has no rhonchi. He has no rales.  egophany normal all fields  Abdominal: Soft. He exhibits no distension.  Musculoskeletal: Normal range of motion. He exhibits no edema or tenderness.  Lymphadenopathy:    He has no cervical adenopathy.  Neurological: He is alert and oriented to person, place, and time. He exhibits normal muscle tone. Coordination normal.  Skin: Skin is warm, dry and intact. No rash noted. He is not diaphoretic. No erythema. No pallor.  Psychiatric: He has a normal mood and affect. His speech is normal and behavior is normal. Judgment and thought content normal. Cognition and memory are normal.  Nursing note and vitals reviewed.   ED Course  Procedures (including critical care time) Labs Review Labs Reviewed - No data to display  Imaging Review Dg Chest 2 View  02/27/2015   CLINICAL DATA:  Abnormal prior chest radiograph  EXAM: CHEST  2 VIEW  COMPARISON:  December 31, 2014  FINDINGS: Ill-defined opacity is again noted in the  right lower lobe region, similar to the appearance on prior study. Elsewhere lungs are clear. The heart size and pulmonary vascularity are normal. No adenopathy. No bone lesions.  IMPRESSION: Stable ill-defined opacity right lower lobe, not felt to be appreciably changed compared to the prior study. Differential considerations include persistent focus of  pneumonia or atypical ground-glass type appearing neoplasm. Given concern for potential neoplasm, chest CT, ideally with intravenous contrast, is advised to further evaluate. Elsewhere lungs clear. No adenopathy appreciable.  These results will be called to the ordering clinician or representative by the Radiologist Assistant, and communication documented in the PACS or zVision Dashboard.   Electronically Signed   By: Bretta Bang III M.D.   On: 02/27/2015 09:38    CLINICAL DATA: 54 year old male with productive cough, yellow sputum for 2 weeks. Back pain and weakness. Smoker. Initial encounter.  EXAM: CHEST 2 VIEW  COMPARISON: None.  FINDINGS: Large lung volumes. Subtle patchy and asymmetric opacity projecting in the superior segment of the right lower lobe (arrows). No superimposed pneumothorax, pulmonary edema, pleural effusion, or consolidation. Normal cardiac size and mediastinal contours. Visualized tracheal air column is within normal limits. No acute osseous abnormality identified.  IMPRESSION: Patchy and indistinct opacity in the superior segment of the right lower lobe. Followup PA and lateral chest X-ray is recommended in 3-4 weeks following trial of antibiotic therapy to ensure resolution and exclude underlying malignancy.   Electronically Signed  By: Odessa Fleming M.D.  On: 12/31/2014 13:22 MDM   1. Chronic cough    Discussed with patient chest xray still with opacity and with family history of neoplasm and patient smoking history recommended chest CT. Patient unable to stay today will return on another day to have completed.  Nurse scheduling with him.  Patient given copy of radiology reports previous and todays.  Discussed could be resolving pneumonia again as treated with azithromycin or neoplasm.    Differential Diagnoses:  Reactive Airway Disease (asthma, allergic aspergillosis eosinophilia), chronic bronchitis, respiratory infection (sinusitis, common  cold, pneumonia), congestive heart failure, smoke/irritant exposure, reflux esophagitis, bronchogenic tumor, and/or aspiration syndromes.  I discussed that approximately 50% of patients with acute bronchitis have a cough that lasts up to three weeks, and 25% for over a month. Tylenol, one to two tablets every four hours as needed for fever or myalgias.   No aspirin. Patient instructed to follow up in one week ASAP for CT scan and as needed if symptoms worsen. Patient verbalized agreement and understanding of treatment plan and had no further questions at this time.  P2:  hand washing and cover cough   Barbaraann Barthel, NP 02/27/15 1016

## 2015-02-27 NOTE — ED Notes (Signed)
CT of the chest with contrast scheduled for the patient at the Outpatient [name] location for August 11 at 9:30am.  Patient verbalized understanding and to come in at 9:00am on August 11 to complete registration.

## 2015-02-27 NOTE — ED Notes (Signed)
Patient c/o cough and chest congestion that started back a week ago.  Patient was previously diagnosed with pneumonia.  Patient denies fevers.

## 2015-02-27 NOTE — Discharge Instructions (Signed)
CT Scan A computed tomography (CT) scan is a specialized X-ray scan. It uses X-rays and a computer to make pictures of different areas of your body. A CT scan can offer more detailed information than a regular X-ray exam. The CT scan provides data about internal organs, soft tissue structures, blood vessels, and bones.  The CT scanner is a large machine that takes pictures of your body as you move through the opening.  LET [facility] CARE PROVIDER KNOW ABOUT:  Any allergies you have.   All medicines you are taking, including vitamins, herbs, eye drops, creams, and over-the-counter medicines.   Previous problems you or members of your family have had with the use of anesthetics.   Any blood disorders you have.   Previous surgeries you have had.   Medical conditions you have. RISKS AND COMPLICATIONS  Generally, this is a safe procedure. However, as with any procedure, problems can occur. Possible problems include:   An allergic reaction to the contrast material.   Development of cancer from excessive exposure to radiation. The risk of this is small.  BEFORE THE PROCEDURE   The day before the test, stop drinking caffeinated beverages. These include energy drinks, tea, soda, coffee, and hot chocolate.   On the day of the test:  About 4 hours before the test, stop eating and drinking anything but water as advised by your health care provider.   Avoid wearing jewelry. You will have to partly or fully undress and wear a hospital gown. PROCEDURE   You will be asked to lie on a table with your arms above your head.   If contrast dye is to be used for the test, an IV tube will be inserted in your arm. The contrast dye will be injected into the IV tube. You might feel warm, or you may get a metallic taste in your mouth.   The table you will be lying on will move into a large machine that will do the scanning.   You will be able to see, hear, and talk to the person running the  machine while you are in it. Follow that person's directions.   The CT machine will move around you to take pictures. Do not move while it is scanning. This helps to get a good image.   When the best possible pictures have been taken, the machine will be turned off. The table will be moved out of the machine. The IV tube will then be removed. AFTER THE PROCEDURE  Ask your health care provider when to follow up for your test results. Document Released: 08/18/2004 Document Revised: 07/16/2013 Document Reviewed: 03/18/2013 [facility] Patient Information 2015 [place], [place]. This information is not intended to replace advice given to you by your health care provider. Make sure you discuss any questions you have with your health care provider. Cough, Adult  A cough is a reflex that helps clear your throat and airways. It can help heal the body or may be a reaction to an irritated airway. A cough may only last 2 or 3 weeks (acute) or may last more than 8 weeks (chronic).  CAUSES Acute cough:  Viral or bacterial infections. Chronic cough:  Infections.  Allergies.  Asthma.  Post-nasal drip.  Smoking.  Heartburn or acid reflux.  Some medicines.  Chronic lung problems (COPD).  Cancer. SYMPTOMS   Cough.  Fever.  Chest pain.  Increased breathing rate.  High-pitched whistling sound when breathing (wheezing).  Colored mucus that you cough up (  sputum). TREATMENT   A bacterial cough may be treated with antibiotic medicine.  A viral cough must run its course and will not respond to antibiotics.  Your caregiver may recommend other treatments if you have a chronic cough. HOME CARE INSTRUCTIONS   Only take over-the-counter or prescription medicines for pain, discomfort, or fever as directed by your caregiver. Use cough suppressants only as directed by your caregiver.  Use a cold steam vaporizer or humidifier in your bedroom or home to help loosen secretions.  Sleep in a  semi-upright position if your cough is worse at night.  Rest as needed.  Stop smoking if you smoke. SEEK IMMEDIATE MEDICAL CARE IF:   You have pus in your sputum.  Your cough starts to worsen.  You cannot control your cough with suppressants and are losing sleep.  You begin coughing up blood.  You have difficulty breathing.  You develop pain which is getting worse or is uncontrolled with medicine.  You have a fever. MAKE SURE YOU:   Understand these instructions.  Will watch your condition.  Will get help right away if you are not doing well or get worse. Document Released: 01/07/2011 Document Revised: 10/03/2011 Document Reviewed: 01/07/2011 [facility] Patient Information 2015 [place], [place]. This information is not intended to replace advice given to you by your health care provider. Make sure you discuss any questions you have with your health care provider.

## 2015-03-05 ENCOUNTER — Ambulatory Visit: Admit: 2015-03-05 | Payer: Self-pay

## 2015-08-03 ENCOUNTER — Emergency Department: Payer: Self-pay

## 2015-08-03 ENCOUNTER — Inpatient Hospital Stay
Admission: EM | Admit: 2015-08-03 | Discharge: 2015-08-05 | DRG: 419 | Disposition: A | Discharge status: Home / Self Care | Payer: Self-pay | Attending: Surgery | Admitting: Surgery

## 2015-08-03 ENCOUNTER — Encounter: Payer: Self-pay | Admitting: Gynecology

## 2015-08-03 ENCOUNTER — Ambulatory Visit
Admission: EM | Admit: 2015-08-03 | Discharge: 2015-08-03 | Payer: Self-pay | Attending: Family Medicine | Admitting: Family Medicine

## 2015-08-03 ENCOUNTER — Encounter: Payer: Self-pay | Admitting: Emergency Medicine

## 2015-08-03 DIAGNOSIS — Z809 Family history of malignant neoplasm, unspecified: Secondary | ICD-10-CM

## 2015-08-03 DIAGNOSIS — Z820 Family history of epilepsy and other diseases of the nervous system: Secondary | ICD-10-CM

## 2015-08-03 DIAGNOSIS — F17210 Nicotine dependence, cigarettes, uncomplicated: Secondary | ICD-10-CM | POA: Insufficient documentation

## 2015-08-03 DIAGNOSIS — R1011 Right upper quadrant pain: Secondary | ICD-10-CM

## 2015-08-03 DIAGNOSIS — J449 Chronic obstructive pulmonary disease, unspecified: Secondary | ICD-10-CM | POA: Diagnosis present

## 2015-08-03 DIAGNOSIS — R1110 Vomiting, unspecified: Secondary | ICD-10-CM | POA: Insufficient documentation

## 2015-08-03 DIAGNOSIS — K810 Acute cholecystitis: Secondary | ICD-10-CM | POA: Diagnosis present

## 2015-08-03 DIAGNOSIS — Z880 Allergy status to penicillin: Secondary | ICD-10-CM

## 2015-08-03 DIAGNOSIS — E876 Hypokalemia: Secondary | ICD-10-CM | POA: Insufficient documentation

## 2015-08-03 DIAGNOSIS — R1013 Epigastric pain: Secondary | ICD-10-CM | POA: Insufficient documentation

## 2015-08-03 DIAGNOSIS — R9431 Abnormal electrocardiogram [ECG] [EKG]: Secondary | ICD-10-CM

## 2015-08-03 DIAGNOSIS — K219 Gastro-esophageal reflux disease without esophagitis: Secondary | ICD-10-CM | POA: Diagnosis present

## 2015-08-03 DIAGNOSIS — K5730 Diverticulosis of large intestine without perforation or abscess without bleeding: Secondary | ICD-10-CM | POA: Diagnosis present

## 2015-08-03 DIAGNOSIS — Z885 Allergy status to narcotic agent status: Secondary | ICD-10-CM

## 2015-08-03 DIAGNOSIS — K8012 Calculus of gallbladder with acute and chronic cholecystitis without obstruction: Principal | ICD-10-CM | POA: Diagnosis present

## 2015-08-03 LAB — COMPREHENSIVE METABOLIC PANEL
ALT: 10 U/L — ABNORMAL LOW (ref 17–63)
AST: 28 U/L (ref 15–41)
Albumin: 4.4 g/dL (ref 3.5–5.0)
Alkaline Phosphatase: 73 U/L (ref 38–126)
Anion gap: 15 (ref 5–15)
BUN: 9 mg/dL (ref 6–20)
CALCIUM: 9.9 mg/dL (ref 8.9–10.3)
CHLORIDE: 92 mmol/L — AB (ref 101–111)
CO2: 28 mmol/L (ref 22–32)
Creatinine, Ser: 0.85 mg/dL (ref 0.61–1.24)
Glucose, Bld: 192 mg/dL — ABNORMAL HIGH (ref 65–99)
Potassium: 2.7 mmol/L — CL (ref 3.5–5.1)
SODIUM: 135 mmol/L (ref 135–145)
TOTAL PROTEIN: 7.8 g/dL (ref 6.5–8.1)
Total Bilirubin: 0.7 mg/dL (ref 0.3–1.2)

## 2015-08-03 LAB — URINALYSIS COMPLETE WITH MICROSCOPIC (ARMC ONLY)
BILIRUBIN URINE: NEGATIVE
Bacteria, UA: NONE SEEN
GLUCOSE, UA: 150 mg/dL — AB
KETONES UR: NEGATIVE mg/dL
Leukocytes, UA: NEGATIVE
NITRITE: NEGATIVE
Protein, ur: NEGATIVE mg/dL
SPECIFIC GRAVITY, URINE: 1.026 (ref 1.005–1.030)
pH: 8 (ref 5.0–8.0)

## 2015-08-03 LAB — CBC WITH DIFFERENTIAL/PLATELET
BASOS ABS: 0.1 10*3/uL (ref 0–0.1)
Basophils Relative: 1 %
EOS ABS: 0 10*3/uL (ref 0–0.7)
Eosinophils Relative: 0 %
HCT: 45.3 % (ref 40.0–52.0)
HEMOGLOBIN: 15.4 g/dL (ref 13.0–18.0)
LYMPHS ABS: 1.2 10*3/uL (ref 1.0–3.6)
Lymphocytes Relative: 8 %
MCH: 30 pg (ref 26.0–34.0)
MCHC: 34.1 g/dL (ref 32.0–36.0)
MCV: 88.1 fL (ref 80.0–100.0)
Monocytes Absolute: 0.7 10*3/uL (ref 0.2–1.0)
Monocytes Relative: 5 %
NEUTROS PCT: 86 %
Neutro Abs: 12 10*3/uL — ABNORMAL HIGH (ref 1.4–6.5)
PLATELETS: 220 10*3/uL (ref 150–440)
RBC: 5.14 MIL/uL (ref 4.40–5.90)
RDW: 14.6 % — ABNORMAL HIGH (ref 11.5–14.5)
WBC: 14 10*3/uL — AB (ref 3.8–10.6)

## 2015-08-03 LAB — CK: CK TOTAL: 97 U/L (ref 49–397)

## 2015-08-03 LAB — TROPONIN I

## 2015-08-03 LAB — LIPASE, BLOOD: LIPASE: 16 U/L (ref 11–51)

## 2015-08-03 MED ORDER — IOHEXOL 300 MG/ML  SOLN
100.0000 mL | Freq: Once | INTRAMUSCULAR | Status: AC | PRN
  Administered 2015-08-03: 100 mL via INTRAVENOUS

## 2015-08-03 MED ORDER — HYDROMORPHONE HCL 1 MG/ML IJ SOLN
0.5000 mg | INTRAMUSCULAR | Status: AC
  Administered 2015-08-03: 0.5 mg via INTRAVENOUS
  Filled 2015-08-03: qty 1

## 2015-08-03 MED ORDER — IOHEXOL 240 MG/ML SOLN: 25.0000 mL | Freq: Once | INTRAMUSCULAR | Status: DC | PRN

## 2015-08-03 MED ORDER — ONDANSETRON HCL 4 MG/2ML IJ SOLN
INTRAMUSCULAR | Status: AC
  Filled 2015-08-03: qty 2

## 2015-08-03 MED ORDER — HYDROMORPHONE HCL 1 MG/ML IJ SOLN: 1.0000 mg | Freq: Once | INTRAMUSCULAR | Status: DC

## 2015-08-03 MED ORDER — CIPROFLOXACIN IN D5W 400 MG/200ML IV SOLN
400.0000 mg | Freq: Once | INTRAVENOUS | Status: AC
  Administered 2015-08-03: 400 mg via INTRAVENOUS
  Filled 2015-08-03: qty 200

## 2015-08-03 MED ORDER — LEVOFLOXACIN IN D5W 750 MG/150ML IV SOLN
INTRAVENOUS | Status: AC
  Administered 2015-08-03: 750 mg via INTRAVENOUS
  Filled 2015-08-03: qty 150

## 2015-08-03 MED ORDER — ONDANSETRON HCL 4 MG PO TABS: 4.0000 mg | ORAL_TABLET | Freq: Four times a day (QID) | ORAL | Status: DC | PRN

## 2015-08-03 MED ORDER — METOPROLOL TARTRATE 25 MG PO TABS
ORAL_TABLET | ORAL | Status: AC
  Administered 2015-08-03: 25 mg via ORAL
  Filled 2015-08-03: qty 1

## 2015-08-03 MED ORDER — METOPROLOL TARTRATE 25 MG PO TABS
25.0000 mg | ORAL_TABLET | Freq: Two times a day (BID) | ORAL | Status: DC
  Administered 2015-08-03 – 2015-08-04 (×3): 25 mg via ORAL
  Filled 2015-08-03 (×3): qty 1

## 2015-08-03 MED ORDER — HYDROMORPHONE HCL 1 MG/ML IJ SOLN
0.5000 mg | Freq: Once | INTRAMUSCULAR | Status: AC
  Administered 2015-08-03: 0.5 mg via INTRAMUSCULAR

## 2015-08-03 MED ORDER — HYDRALAZINE HCL 20 MG/ML IJ SOLN
INTRAMUSCULAR | Status: AC
  Administered 2015-08-03: 5 mg via INTRAVENOUS
  Filled 2015-08-03: qty 1

## 2015-08-03 MED ORDER — POTASSIUM CHLORIDE 10 MEQ/100ML IV SOLN
10.0000 meq | INTRAVENOUS | Status: AC
  Administered 2015-08-03 (×2): 10 meq via INTRAVENOUS
  Filled 2015-08-03 (×2): qty 100

## 2015-08-03 MED ORDER — SODIUM CHLORIDE 0.9 % IV BOLUS (SEPSIS)
1000.0000 mL | Freq: Once | INTRAVENOUS | Status: AC
  Administered 2015-08-03: 1000 mL via INTRAVENOUS

## 2015-08-03 MED ORDER — ONDANSETRON HCL 4 MG/2ML IJ SOLN
4.0000 mg | Freq: Four times a day (QID) | INTRAMUSCULAR | Status: DC | PRN
  Administered 2015-08-03 – 2015-08-04 (×3): 4 mg via INTRAVENOUS
  Filled 2015-08-03 (×3): qty 2

## 2015-08-03 MED ORDER — LEVOFLOXACIN IN D5W 750 MG/150ML IV SOLN
750.0000 mg | INTRAVENOUS | Status: DC
  Administered 2015-08-03 – 2015-08-04 (×2): 750 mg via INTRAVENOUS
  Filled 2015-08-03 (×2): qty 150

## 2015-08-03 MED ORDER — ONDANSETRON 8 MG PO TBDP
8.0000 mg | ORAL_TABLET | Freq: Once | ORAL | Status: AC
  Administered 2015-08-03: 8 mg via ORAL

## 2015-08-03 MED ORDER — HYDROMORPHONE HCL 1 MG/ML IJ SOLN
1.0000 mg | INTRAMUSCULAR | Status: DC | PRN
  Administered 2015-08-03 – 2015-08-04 (×7): 1 mg via INTRAVENOUS
  Filled 2015-08-03 (×7): qty 1

## 2015-08-03 MED ORDER — KCL IN DEXTROSE-NACL 30-5-0.45 MEQ/L-%-% IV SOLN
INTRAVENOUS | Status: DC
  Administered 2015-08-03 – 2015-08-05 (×6): via INTRAVENOUS
  Filled 2015-08-03 (×11): qty 1000

## 2015-08-03 MED ORDER — HEPARIN SODIUM (PORCINE) 5000 UNIT/ML IJ SOLN
5000.0000 [IU] | Freq: Three times a day (TID) | INTRAMUSCULAR | Status: DC
  Administered 2015-08-03 – 2015-08-05 (×4): 5000 [IU] via SUBCUTANEOUS
  Filled 2015-08-03 (×4): qty 1

## 2015-08-03 MED ORDER — HYDRALAZINE HCL 20 MG/ML IJ SOLN
5.0000 mg | INTRAMUSCULAR | Status: DC | PRN
  Administered 2015-08-03: 5 mg via INTRAVENOUS

## 2015-08-03 NOTE — H&P (Signed)
Brian Walker is an 55 y.o. male.    Chief Complaint: Epigastric pain  HPI: This patient with right upper quadrant epigastric pain that started yesterday acutely nausea and approximately 10 or greater emesis. He has had loose stools. Never had an episode like this before denies fevers or chills. Denies jaundice or acholic stools. Denies back pain  History reviewed. No pertinent past medical history.  Past Surgical History  Procedure Laterality Date  . Knee surgery Bilateral   . Knee surgery      bilateral    Family History  Problem Relation Age of Onset  . Parkinson's disease Mother   . Cancer Father   . Cancer Sister   . Lupus Sister    Social History:  reports that he has been smoking Cigarettes.  He has been smoking about 0.50 packs per day. He has never used smokeless tobacco. He reports that he does not drink alcohol or use illicit drugs.  Allergies:  Allergies  Allergen Reactions  . Codeine Nausea And Vomiting and Rash  . Penicillins Nausea And Vomiting, Swelling, Rash and Other (See Comments)    Pt states that this medication caused his throat to swell.   Has patient had a PCN reaction causing immediate rash, facial/tongue/throat swelling, SOB or lightheadedness with hypotension: Yes Has patient had a PCN reaction causing severe rash involving mucus membranes or skin necrosis: No Has patient had a PCN reaction that required hospitalization No Has patient had a PCN reaction occurring within the last 10 years: No If all of the above answers are "NO", then may proceed with Cephalosporin use.     (Not in a hospital admission)   Review of Systems  Constitutional: Negative for fever and chills.  HENT: Negative.   Eyes: Negative.   Respiratory: Negative.   Cardiovascular: Negative.   Gastrointestinal: Positive for nausea, vomiting, abdominal pain and diarrhea. Negative for heartburn, constipation, blood in stool and melena.  Genitourinary: Negative.    Musculoskeletal: Negative.   Skin: Negative.   Neurological: Negative.   Endo/Heme/Allergies: Negative.   Psychiatric/Behavioral: Negative.      Physical Exam:  BP 195/105 mmHg  Pulse 61  Temp(Src) 98.3 F (36.8 C) (Oral)  Resp 22  Ht 5\' 9"  (1.753 m)  Wt 150 lb (68.04 kg)  BMI 22.14 kg/m2  SpO2 96%  Physical Exam  Constitutional: He is oriented to person, place, and time and well-developed, well-nourished, and in no distress. No distress.  HENT:  Head: Normocephalic and atraumatic.  Eyes: Pupils are equal, round, and reactive to light. Right eye exhibits no discharge. Left eye exhibits no discharge. No scleral icterus.  Neck: Normal range of motion.  Cardiovascular: Normal rate, regular rhythm and normal heart sounds.   Pulmonary/Chest: Effort normal and breath sounds normal. No respiratory distress. He has no wheezes. He has no rales.  Abdominal: Soft. He exhibits no distension. There is tenderness. There is no rebound and no guarding.  Tender in the right upper quadrant with a questionable Murphy sign  Musculoskeletal: Normal range of motion. He exhibits no edema or tenderness.  Lymphadenopathy:    He has no cervical adenopathy.  Neurological: He is alert and oriented to person, place, and time.  Skin: Skin is warm and dry. No rash noted. He is not diaphoretic. No erythema. No pallor.  Psychiatric: Mood and affect normal.  Vitals reviewed.       Results for orders placed or performed during the hospital encounter of 08/03/15 (from the past 48 hour(s))  Troponin I     Status: None   Collection Time: 08/03/15 12:56 PM  Result Value Ref Range   Troponin I <0.03 <0.031 ng/mL    Comment:        NO INDICATION OF MYOCARDIAL INJURY.   CK     Status: None   Collection Time: 08/03/15 12:56 PM  Result Value Ref Range   Total CK 97 49 - 397 U/L   Ct Abdomen Pelvis W Contrast  08/03/2015  CLINICAL DATA:  Abdominal pain for 1 day with nausea and vomiting EXAM: CT  ABDOMEN AND PELVIS WITH CONTRAST TECHNIQUE: Multidetector CT imaging of the abdomen and pelvis was performed using the standard protocol following bolus administration of intravenous contrast. CONTRAST:  OMNIPAQUE IOHEXOL 300 MG/ML  SOLN COMPARISON:  None. FINDINGS: Lower chest: There is bibasilar lung atelectatic change, more on the right than on the left. Hepatobiliary: There is a cyst in the dome of the liver on the right measuring 1.5 x 1.3 cm. There is a probable cyst in the anterior segment of the right lobe of the liver medially measuring 8 x 6 mm. There is a tiny probable cyst in the anterior segment right lobe of the liver midportion. There is slight fatty infiltration in the liver near the fissure for the ligamentum teres. Within the gallbladder, there is sludge. The gallbladder wall appears mildly ill-defined with questionable pericholecystic fluid. There is no appreciable biliary duct dilatation. Pancreas: No mass or inflammatory focus. Spleen: No splenic lesions are identified. Adrenals/Urinary Tract: Adrenals appear normal bilaterally. There is a cyst in the anterior aspect of the mid right kidney measuring 8 x 6 mm. There is a cyst in the mid right kidney measuring 9 x 7 mm. There is a probable nearby cyst measuring 4 x 5 mm. There is a cyst arising from the periphery of the left kidney measuring 7 x 6 mm. There is no hydronephrosis on either side. There is no appreciable renal or ureteral calculus on either side. The wall of the urinary bladder is mildly thickened in a generalized manner. Stomach/Bowel: Stomach is distended with fluid. There is no appreciable bowel wall or mesenteric thickening. There is no bowel obstruction. No free air or portal venous air. There are occasional sigmoid diverticula without diverticulitis. Vascular/Lymphatic: There is atherosclerotic calcification in the aorta. There is no abdominal aortic aneurysm. The major mesenteric vessels appear patent. There is no  appreciable adenopathy in the abdomen or pelvis. Reproductive: Prostate and seminal vesicles appear within normal limits. There is no pelvic mass or pelvic fluid collection. Other: There is no periappendiceal region inflammation. There is no abscess or ascites in the abdomen or pelvis. Musculoskeletal: There is degenerative change in the lumbar spine with disc space narrowing most marked at L3-4 and L4-5 but also moderate at L2-3. There are no blastic or lytic bone lesions. 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 No intramuscular or abdominal wall lesions are identified. IMPRESSION: There is sludge in the gallbladder. Gallbladder appears rather distended with an ill-defined wall and suspected pericholecystic fluid. These findings are concerning for acute cholecystitis. There is thickening of the urinary bladder wall. Question a degree of cystitis. No bowel obstruction. No abscess. No periappendiceal region inflammation. No renal or ureteral calculus. No hydronephrosis. There are occasional sigmoid diverticula without diverticulitis. Electronically Signed   By: Bretta Bang III M.D.   On: 08/03/2015 14:06     Assessment/Plan  Suspect acute cholecystitis CT scan and labs of been reviewed.  I recommended admission to the hospital and repletion of his potassium. I will start IV antibiotics as well and once his potassium is improved we can proceed with a laparoscopic cholecystectomy. I discussed with him the rationale for this approach and the procedure itself and the risk bleeding infection recurrence of symptoms conversion to an open procedure bile duct damage bile duct leak retained common bile duct stone this was all reviewed for he and his family but prior to surgery we will proceed with correcting his electrolyte abnormalities with his hypokalemia he understood and agreed to proceed  Lattie Hawichard E Ciani Rutten, MD, FACS

## 2015-08-03 NOTE — ED Notes (Signed)
By ems from [facility] with mid epigastric pain since last night.  Has iv in place.  [facility]  Says it did not work very well.  Says he usually gets this pain and taks some tums and it gets better.  Also fever.

## 2015-08-03 NOTE — ED Notes (Signed)
Surgery consult at bedside.

## 2015-08-03 NOTE — ED Notes (Signed)
Patient c/o x last pm upper abdomen pain. Per pt. Burping a lot took sum Tums, now with nausea / vomiting and stomach pain.

## 2015-08-03 NOTE — ED Provider Notes (Signed)
CSN: [id number]     Arrival date & time 08/03/15  [id number] History   First MD Initiated Contact with Patient 08/03/15 1025     Chief Complaint  Patient presents with  . Abdominal Pain   (Consider location/radiation/quality/duration/timing/severity/associated sxs/prior Treatment) Patient is a 55 y.o. male presenting with abdominal pain. The history is provided by the patient.  Abdominal Pain Pain location:  Epigastric and RUQ Pain quality: burning, heavy and pressure   Pain radiates to:  Does not radiate Pain severity:  Severe (8/10) Onset quality:  Sudden Timing:  Constant Progression:  Worsening Chronicity:  New Associated symptoms: nausea and vomiting   Vomiting:    Quality:  Stomach contents   Severity:  Severe   Progression:  Worsening   History reviewed. No pertinent past medical history. Past Surgical History  Procedure Laterality Date  . Knee surgery Bilateral   . Knee surgery      bilateral   Family History  Problem Relation Age of Onset  . Parkinson's disease Mother   . Cancer Father   . Cancer Sister   . Lupus Sister    Social History  Substance Use Topics  . Smoking status: Current Every Day Smoker -- 0.50 packs/day    Types: Cigarettes  . Smokeless tobacco: Never Used  . Alcohol Use: No    Review of Systems  Gastrointestinal: Positive for nausea, vomiting and abdominal pain.    Allergies  Codeine and Penicillins  Home Medications   Prior to Admission medications   Medication Sig Start Date End Date Taking? Authorizing Provider  azithromycin (ZITHROMAX) 250 MG tablet Take 1 tablet (250 mg total) by mouth daily. Take first 2 tablets together, then 1 every day until finished. Patient not taking: Reported on 02/20/2015 12/31/14   Barbaraann Barthelina A Betancourt, NP   Meds Ordered and Administered this Visit   Medications  ondansetron (ZOFRAN-ODT) disintegrating tablet 8 mg (8 mg Oral Given 08/03/15 1034)  HYDROmorphone (DILAUDID) injection 0.5 mg (0.5 mg Intramuscular  Given 08/03/15 1148)    BP 194/92 mmHg  Pulse 50  Temp(Src) 95.4 F (35.2 C) (Tympanic)  Resp 18  Ht 5\' 8"  (1.727 m)  Wt 155 lb (70.308 kg)  BMI 23.57 kg/m2  SpO2 100% No data found.   Physical Exam  Constitutional: He is oriented to person, place, and time. He appears well-developed and well-nourished. No distress.  HENT:  Head: Normocephalic and atraumatic.  Cardiovascular: Normal rate, regular rhythm, normal heart sounds and intact distal pulses.   No murmur heard. Pulmonary/Chest: Effort normal and breath sounds normal. No respiratory distress. He has no wheezes. He has no rales.  Abdominal: Soft. Bowel sounds are normal. He exhibits no distension and no mass. There is tenderness (epigastric and right upper quadrant). There is guarding (epigastric). There is no rebound.  Neurological: He is alert and oriented to person, place, and time.  Skin: No rash noted. He is not diaphoretic.  Nursing note and vitals reviewed.   ED Course  Procedures (including critical care time)  Labs Review Labs Reviewed  COMPREHENSIVE METABOLIC PANEL - Abnormal; Notable for the following:    Potassium 2.7 (*)    Chloride 92 (*)    Glucose, Bld 192 (*)    ALT 10 (*)    All other components within normal limits  CBC WITH DIFFERENTIAL/PLATELET - Abnormal; Notable for the following:    WBC 14.0 (*)    RDW 14.6 (*)    Neutro Abs 12.0 (*)    All other  components within normal limits  LIPASE, BLOOD    Imaging Review No results found.   Visual Acuity Review  Right Eye Distance:   Left Eye Distance:   Bilateral Distance:    Right Eye Near:   Left Eye Near:    Bilateral Near:         MDM   1. Abdominal pain, acute, epigastric   2. RUQ abdominal pain   3. Acute vomiting   4. Acute hypokalemia    Acute cholecystitis vs PUD vs other?; patient given Dilaudid 0.5mg  IM x 1 and  po Zofran 8mg  ODT without improvement; recommend patient go to ED for further evaluation and management.  Patient transported by EMS in stable condition. Triage RN at [facility] ED notified by me.      Payton Mccallum, MD 08/03/15 [phone]

## 2015-08-03 NOTE — ED Notes (Signed)
Patient transported to CT 

## 2015-08-03 NOTE — ED Provider Notes (Signed)
[facility] Emergency Department Provider Note  ____________________________________________  Time seen: Approximately 12:39 PM  I have reviewed the triage vital signs and the nursing notes.   HISTORY  Chief Complaint Abdominal Pain    HPI Brian Walker is a 55 y.o. male evaluation of abdominal pain. Sudden onset of severe upper abdominal painstarting about 12 hours ago. Reports vomiting many many times. Described as severe 8-9 out of 10 pressure in the upper stomach. This is associated with vomiting, denies blood in the vomit. He has not had any loose stool. He did notice that his stomach felt bloated, but this isn't better now.  Denies previous similar symptoms. No history of recent surgeries. Denies chest pain or trouble breathing.  No alcohol use.  History reviewed. No pertinent past medical history.  There are no active problems to display for this patient.   Past Surgical History  Procedure Laterality Date  . Knee surgery Bilateral   . Knee surgery      bilateral    Current Outpatient Rx  Name  Route  Sig  Dispense  Refill  . azithromycin (ZITHROMAX) 250 MG tablet   Oral   Take 1 tablet (250 mg total) by mouth daily. Take first 2 tablets together, then 1 every day until finished. Patient not taking: Reported on 02/20/2015   6 tablet   0     Allergies Codeine and Penicillins  Family History  Problem Relation Age of Onset  . Parkinson's disease Mother   . Cancer Father   . Cancer Sister   . Lupus Sister     Social History Social History  Substance Use Topics  . Smoking status: Current Every Day Smoker -- 0.50 packs/day    Types: Cigarettes  . Smokeless tobacco: Never Used  . Alcohol Use: No    Review of Systems Constitutional: No fever/chills Eyes: No visual changes. ENT: No sore throat. Cardiovascular: Denies chest pain. Respiratory: Denies shortness of breath. Gastrointestinal: See history of present illness  Genitourinary: Negative for dysuria. Musculoskeletal: Negative for back pain. Skin: Negative for rash. Neurological: Negative for headaches, focal weakness or numbness.  10-point ROS otherwise negative.  ____________________________________________   PHYSICAL EXAM:  VITAL SIGNS: ED Triage Vitals  Enc Vitals Group     BP 08/03/15 1223 175/98 mmHg     Pulse Rate 08/03/15 1223 51     Resp 08/03/15 1223 24     Temp 08/03/15 1223 98.3 F (36.8 C)     Temp Source 08/03/15 1223 Oral     SpO2 08/03/15 1223 98 %     Weight 08/03/15 1223 150 lb (68.04 kg)     Height 08/03/15 1223 5\' 9"  (1.753 m)     Head Cir --      Peak Flow --      Pain Score 08/03/15 1224 7     Pain Loc --      Pain Edu? --      Excl. in GC? --    Constitutional: Alert and oriented. Somewhat ill-appearing but in no distress. Resting on the stretcher with his girlfriend present. Eyes: Conjunctivae are normal. PERRL. EOMI. Head: Atraumatic. Nose: No congestion/rhinnorhea. Mouth/Throat: Mucous membranes are dry.  Oropharynx non-erythematous. Neck: No stridor.   Cardiovascular: Normal rate, regular rhythm. Grossly normal heart sounds.  Good peripheral circulation. Respiratory: Normal respiratory effort.  No retractions. Lungs CTAB. Gastrointestinal: Patient has focal and fairly moderate to severe tenderness in the epigastrium and right upper quadrant. Minor tenderness with palpation  in the lower abdomen refers pain to the upper portion. No distention. No abdominal bruits. No CVA tenderness. No groin pain or mass. Musculoskeletal: No lower extremity tenderness nor edema.  No joint effusions. Strong peripheral pulses Neurologic:  Normal speech and language. No gross focal neurologic deficits are appreciated. Skin:  Skin is warm, dry and intact. No rash noted. Psychiatric: Mood and affect are normal. Speech and behavior are normal.  ____________________________________________   LABS (all labs ordered are listed, but  only abnormal results are displayed)  Labs Reviewed  TROPONIN I  CK  URINALYSIS COMPLETEWITH MICROSCOPIC ([facility] ONLY)   ____________________________________________  EKG  ED ECG REPORT I, Elyan Vanwieren, the attending physician, personally viewed and interpreted this ECG.  Date: 08/03/2015 EKG Time: 1045 Rate: 52 Rhythm: Sinus bradycardia QRS Axis: normal Intervals: normal ST/T Wave abnormalities: normal Conduction Disutrbances: none Narrative Interpretation: Sinus bradycardia without ischemic abnormality  ED ECG REPORT I, Harald Quevedo, the attending physician, personally viewed and interpreted this ECG.  Date: 08/03/2015 EKG Time: 1310 Rate: 62 Rhythm: normal sinus rhythm QRS Axis: normal Intervals: normal ST/T Wave abnormalities: W waves noted Conduction Disutrbances: none Narrative Interpretation: unremarkable   ____________________________________________  RADIOLOGY  IMPRESSION: There is sludge in the gallbladder. Gallbladder appears rather distended with an ill-defined wall and suspected pericholecystic fluid. These findings are concerning for acute cholecystitis.  There is thickening of the urinary bladder wall. Question a degree of cystitis.  No bowel obstruction. No abscess. No periappendiceal region inflammation. No renal or ureteral calculus. No hydronephrosis. There are occasional sigmoid diverticula without diverticulitis.   Electronically Signed By: Bretta Bang III M.D. On: 08/03/2015 14:06 ____________________________________________   PROCEDURES  Procedure(s) performed: None  Critical Care performed: No  ____________________________________________   INITIAL IMPRESSION / ASSESSMENT AND PLAN / ED COURSE  Pertinent labs & imaging results that were available during my care of the patient were reviewed by me and considered in my medical decision making (see chart for details).  ----------------------------------------- 1:54  PM on 08/03/2015 -----------------------------------------  Patient is resting more comfortably now. He reports his pain is improving. He continues to have nausea and states that he cannot tolerate taking oral contrast.  ----------------------------------------- 2:23 PM on 08/03/2015 -----------------------------------------  Patient reports pain is improving, however still tender. Does not wish any additional pain medicine. He does appear improved. Hypertension is notable, but I suspect he has acute cholecystitis and being preoperative likely defer to anesthesia for further management as I do not believe that the hypertension is related other than perhaps secondary to pain.  Discussed with Dr. Excell Seltzer who will evaluate. ____________________________________________   FINAL CLINICAL IMPRESSION(S) / ED DIAGNOSES  Final diagnoses:  Acute cholecystitis  Acute hypokalemia  EKG abnormalities      Sharyn Creamer, MD 08/03/15 1427

## 2015-08-04 ENCOUNTER — Inpatient Hospital Stay: Payer: Self-pay | Admitting: Certified Registered Nurse Anesthetist

## 2015-08-04 ENCOUNTER — Encounter: Payer: Self-pay | Admitting: *Deleted

## 2015-08-04 ENCOUNTER — Encounter
Admission: EM | Disposition: A | Discharge status: Home / Self Care | Payer: Self-pay | Source: Home / Self Care | Attending: Surgery

## 2015-08-04 HISTORY — PX: CHOLECYSTECTOMY: SHX55

## 2015-08-04 LAB — CBC
HCT: 46 % (ref 40.0–52.0)
Hemoglobin: 15.4 g/dL (ref 13.0–18.0)
MCH: 29.3 pg (ref 26.0–34.0)
MCHC: 33.4 g/dL (ref 32.0–36.0)
MCV: 87.9 fL (ref 80.0–100.0)
PLATELETS: 211 10*3/uL (ref 150–440)
RBC: 5.24 MIL/uL (ref 4.40–5.90)
RDW: 15.1 % — AB (ref 11.5–14.5)
WBC: 16.8 10*3/uL — ABNORMAL HIGH (ref 3.8–10.6)

## 2015-08-04 LAB — COMPREHENSIVE METABOLIC PANEL
ALBUMIN: 3.8 g/dL (ref 3.5–5.0)
ALK PHOS: 66 U/L (ref 38–126)
ALT: 12 U/L — AB (ref 17–63)
AST: 22 U/L (ref 15–41)
Anion gap: 5 (ref 5–15)
BILIRUBIN TOTAL: 0.5 mg/dL (ref 0.3–1.2)
BUN: 10 mg/dL (ref 6–20)
CALCIUM: 9 mg/dL (ref 8.9–10.3)
CO2: 30 mmol/L (ref 22–32)
CREATININE: 0.91 mg/dL (ref 0.61–1.24)
Chloride: 101 mmol/L (ref 101–111)
GFR calc Af Amer: >60 mL/min (ref >60)
GFR calc non Af Amer: >60 mL/min (ref >60)
GLUCOSE: 131 mg/dL — AB (ref 65–99)
Potassium: 3.1 mmol/L — ABNORMAL LOW (ref 3.5–5.1)
Sodium: 136 mmol/L (ref 135–145)
TOTAL PROTEIN: 6.9 g/dL (ref 6.5–8.1)

## 2015-08-04 SURGERY — LAPAROSCOPIC CHOLECYSTECTOMY
Anesthesia: General

## 2015-08-04 MED ORDER — SODIUM CHLORIDE 0.9 % IR SOLN
Status: DC | PRN
  Administered 2015-08-04: 1000 mL

## 2015-08-04 MED ORDER — OXYCODONE-ACETAMINOPHEN 5-325 MG PO TABS: 1.0000 | ORAL_TABLET | ORAL | Status: DC | PRN

## 2015-08-04 MED ORDER — MIDAZOLAM HCL 2 MG/2ML IJ SOLN
INTRAMUSCULAR | Status: DC | PRN
  Administered 2015-08-04: 2 mg via INTRAVENOUS

## 2015-08-04 MED ORDER — ROCURONIUM BROMIDE 100 MG/10ML IV SOLN
INTRAVENOUS | Status: DC | PRN
  Administered 2015-08-04: 35 mg via INTRAVENOUS

## 2015-08-04 MED ORDER — FENTANYL CITRATE (PF) 100 MCG/2ML IJ SOLN
INTRAMUSCULAR | Status: AC
  Administered 2015-08-04: 25 ug via INTRAVENOUS
  Filled 2015-08-04: qty 2

## 2015-08-04 MED ORDER — GLYCOPYRROLATE 0.2 MG/ML IJ SOLN
INTRAMUSCULAR | Status: DC | PRN
  Administered 2015-08-04: 0.2 mg via INTRAVENOUS

## 2015-08-04 MED ORDER — LACTATED RINGERS IV SOLN
INTRAVENOUS | Status: DC
  Administered 2015-08-04: 11:00:00 via INTRAVENOUS

## 2015-08-04 MED ORDER — OXYCODONE-ACETAMINOPHEN 5-325 MG PO TABS
1.0000 | ORAL_TABLET | ORAL | Status: DC | PRN
  Administered 2015-08-04: 1 via ORAL
  Administered 2015-08-05 (×2): 2 via ORAL
  Filled 2015-08-04: qty 1
  Filled 2015-08-04 (×2): qty 2

## 2015-08-04 MED ORDER — OXYCODONE HCL 5 MG/5ML PO SOLN: 5.0000 mg | Freq: Once | ORAL | Status: DC | PRN

## 2015-08-04 MED ORDER — OXYCODONE HCL 5 MG PO TABS: 5.0000 mg | ORAL_TABLET | Freq: Once | ORAL | Status: DC | PRN

## 2015-08-04 MED ORDER — FENTANYL CITRATE (PF) 100 MCG/2ML IJ SOLN
25.0000 ug | INTRAMUSCULAR | Status: DC | PRN
  Administered 2015-08-04 (×4): 25 ug via INTRAVENOUS

## 2015-08-04 MED ORDER — KETOROLAC TROMETHAMINE 30 MG/ML IJ SOLN
30.0000 mg | Freq: Four times a day (QID) | INTRAMUSCULAR | Status: DC
  Administered 2015-08-04 – 2015-08-05 (×2): 30 mg via INTRAVENOUS
  Filled 2015-08-04 (×3): qty 1

## 2015-08-04 MED ORDER — CIPROFLOXACIN HCL 500 MG PO TABS: 500.0000 mg | ORAL_TABLET | Freq: Two times a day (BID) | ORAL | Status: DC

## 2015-08-04 MED ORDER — PHENYLEPHRINE HCL 10 MG/ML IJ SOLN
INTRAMUSCULAR | Status: DC | PRN
  Administered 2015-08-04: 100 ug via INTRAVENOUS
  Administered 2015-08-04: 200 ug via INTRAVENOUS

## 2015-08-04 MED ORDER — LIDOCAINE HCL (CARDIAC) 20 MG/ML IV SOLN
INTRAVENOUS | Status: DC | PRN
  Administered 2015-08-04: 100 mg via INTRAVENOUS

## 2015-08-04 MED ORDER — KETOROLAC TROMETHAMINE 30 MG/ML IJ SOLN
INTRAMUSCULAR | Status: DC | PRN
  Administered 2015-08-04: 30 mg via INTRAVENOUS

## 2015-08-04 MED ORDER — BUPIVACAINE-EPINEPHRINE (PF) 0.25% -1:200000 IJ SOLN
INTRAMUSCULAR | Status: AC
  Filled 2015-08-04: qty 30

## 2015-08-04 MED ORDER — METOPROLOL TARTRATE 25 MG PO TABS: 25.0000 mg | ORAL_TABLET | Freq: Two times a day (BID) | ORAL | Status: DC

## 2015-08-04 MED ORDER — SUCCINYLCHOLINE CHLORIDE 20 MG/ML IJ SOLN
INTRAMUSCULAR | Status: DC | PRN
  Administered 2015-08-04: 100 mg via INTRAVENOUS

## 2015-08-04 MED ORDER — IPRATROPIUM-ALBUTEROL 0.5-2.5 (3) MG/3ML IN SOLN
3.0000 mL | Freq: Once | RESPIRATORY_TRACT | Status: AC
  Administered 2015-08-04: 3 mL via RESPIRATORY_TRACT

## 2015-08-04 MED ORDER — ONDANSETRON HCL 4 MG/2ML IJ SOLN
INTRAMUSCULAR | Status: DC | PRN
  Administered 2015-08-04: 4 mg via INTRAVENOUS

## 2015-08-04 MED ORDER — SUGAMMADEX SODIUM 200 MG/2ML IV SOLN
INTRAVENOUS | Status: DC | PRN
  Administered 2015-08-04: 137.4 mg via INTRAVENOUS

## 2015-08-04 MED ORDER — HYDROMORPHONE HCL 1 MG/ML IJ SOLN
0.5000 mg | INTRAMUSCULAR | Status: DC | PRN
  Administered 2015-08-04 – 2015-08-05 (×2): 0.5 mg via INTRAVENOUS
  Filled 2015-08-04 (×2): qty 1

## 2015-08-04 MED ORDER — FENTANYL CITRATE (PF) 100 MCG/2ML IJ SOLN
INTRAMUSCULAR | Status: DC | PRN
  Administered 2015-08-04: 100 ug via INTRAVENOUS
  Administered 2015-08-04 (×2): 50 ug via INTRAVENOUS

## 2015-08-04 MED ORDER — EPHEDRINE SULFATE 50 MG/ML IJ SOLN
INTRAMUSCULAR | Status: DC | PRN
  Administered 2015-08-04: 5 mg via INTRAVENOUS

## 2015-08-04 MED ORDER — PROPOFOL 10 MG/ML IV BOLUS
INTRAVENOUS | Status: DC | PRN
  Administered 2015-08-04: 140 mg via INTRAVENOUS

## 2015-08-04 MED ORDER — BUPIVACAINE-EPINEPHRINE (PF) 0.25% -1:200000 IJ SOLN
INTRAMUSCULAR | Status: DC | PRN
  Administered 2015-08-04: 20 mL via PERINEURAL
  Administered 2015-08-04: 10 mL via PERINEURAL

## 2015-08-04 MED ORDER — HYDROMORPHONE HCL 1 MG/ML IJ SOLN
0.5000 mg | INTRAMUSCULAR | Status: DC | PRN
  Administered 2015-08-04 (×2): 0.5 mg via INTRAVENOUS
  Filled 2015-08-04 (×2): qty 1

## 2015-08-04 MED ORDER — DEXAMETHASONE SODIUM PHOSPHATE 10 MG/ML IJ SOLN
INTRAMUSCULAR | Status: DC | PRN
  Administered 2015-08-04: 4 mg via INTRAVENOUS

## 2015-08-04 SURGICAL SUPPLY — 43 items
ADHESIVE MASTISOL STRL (MISCELLANEOUS) ×3 IMPLANT
APPLIER CLIP ROT 10 11.4 M/L (STAPLE) ×6
BLADE SURG SZ11 CARB STEEL (BLADE) ×3 IMPLANT
CANISTER SUCT 1200ML W/VALVE (MISCELLANEOUS) ×3 IMPLANT
CATH CHOLANGI 4FR 420404F (CATHETERS) IMPLANT
CHLORAPREP W/TINT 26ML (MISCELLANEOUS) ×3 IMPLANT
CLIP APPLIE ROT 10 11.4 M/L (STAPLE) ×2 IMPLANT
CLOSURE WOUND 1/2 X4 (GAUZE/BANDAGES/DRESSINGS) ×1
CONRAY 60ML FOR OR (MISCELLANEOUS) IMPLANT
DRAPE C-ARM XRAY 36X54 (DRAPES) IMPLANT
ENDOPOUCH RETRIEVER 10 (MISCELLANEOUS) ×3 IMPLANT
GAUZE SPONGE NON-WVN 2X2 STRL (MISCELLANEOUS) ×4 IMPLANT
GLOVE BIO SURGEON STRL SZ8 (GLOVE) ×9 IMPLANT
GOWN STRL REUS W/ TWL LRG LVL3 (GOWN DISPOSABLE) ×3 IMPLANT
GOWN STRL REUS W/TWL LRG LVL3 (GOWN DISPOSABLE) ×6
HEMOSTAT SURGICEL 2X3 (HEMOSTASIS) ×6 IMPLANT
IRRIGATION STRYKERFLOW (MISCELLANEOUS) ×1 IMPLANT
IRRIGATOR STRYKERFLOW (MISCELLANEOUS) ×3
IV CATH ANGIO 12GX3 LT BLUE (NEEDLE) IMPLANT
IV NS 1000ML (IV SOLUTION) ×2
IV NS 1000ML BAXH (IV SOLUTION) ×1 IMPLANT
JACKSON PRATT 10 (INSTRUMENTS) ×3 IMPLANT
KIT RM TURNOVER STRD PROC AR (KITS) ×3 IMPLANT
LABEL OR SOLS (LABEL) IMPLANT
NDL SAFETY 22GX1.5 (NEEDLE) ×3 IMPLANT
NEEDLE VERESS 14GA 120MM (NEEDLE) ×3 IMPLANT
NS IRRIG 500ML POUR BTL (IV SOLUTION) ×3 IMPLANT
PACK LAP CHOLECYSTECTOMY (MISCELLANEOUS) ×3 IMPLANT
PAD GROUND ADULT SPLIT (MISCELLANEOUS) ×3 IMPLANT
SCISSORS METZENBAUM CVD 33 (INSTRUMENTS) ×3 IMPLANT
SLEEVE ENDOPATH XCEL 5M (ENDOMECHANICALS) ×3 IMPLANT
SPONGE EXCIL AMD DRAIN 4X4 6P (MISCELLANEOUS) IMPLANT
SPONGE LAP 18X18 5 PK (GAUZE/BANDAGES/DRESSINGS) ×3 IMPLANT
SPONGE VERSALON 2X2 STRL (MISCELLANEOUS) ×8
STRIP CLOSURE SKIN 1/2X4 (GAUZE/BANDAGES/DRESSINGS) ×2 IMPLANT
SUT MNCRL 4-0 (SUTURE) ×2
SUT MNCRL 4-0 27XMFL (SUTURE) ×1
SUT VICRYL 0 AB UR-6 (SUTURE) IMPLANT
SUTURE MNCRL 4-0 27XMF (SUTURE) ×1 IMPLANT
SYR 20CC LL (SYRINGE) ×3 IMPLANT
TROCAR XCEL NON-BLD 11X100MML (ENDOMECHANICALS) ×3 IMPLANT
TROCAR XCEL NON-BLD 5MMX100MML (ENDOMECHANICALS) ×6 IMPLANT
TUBING INSUFFLATOR HI FLOW (MISCELLANEOUS) ×3 IMPLANT

## 2015-08-04 NOTE — Anesthesia Postprocedure Evaluation (Signed)
Anesthesia Post Note  Patient: Brian ShuckWilliam C Walker  Procedure(s) Performed: Procedure(s) (LRB): LAPAROSCOPIC CHOLECYSTECTOMY (N/A)  Patient location during evaluation: PACU Anesthesia Type: General Level of consciousness: awake and alert Pain management: pain level controlled Vital Signs Assessment: post-procedure vital signs reviewed and stable Respiratory status: spontaneous breathing, nonlabored ventilation, respiratory function stable and patient connected to nasal cannula oxygen Cardiovascular status: blood pressure returned to baseline and stable Postop Assessment: no signs of nausea or vomiting Anesthetic complications: no    Last Vitals:  Filed Vitals:   08/04/15 1345 08/04/15 1415  BP: 109/68   Pulse: 78   Temp:  36.4 C  Resp: 14     Last Pain:  Filed Vitals:   08/04/15 1526  PainSc: 6                  Jomarie LongsJoseph K Vianey Caniglia

## 2015-08-04 NOTE — Transfer of Care (Signed)
Immediate Anesthesia Transfer of Care Note  Patient: Brian ShuckWilliam C Ritsema  Procedure(s) Performed: Procedure(s): LAPAROSCOPIC CHOLECYSTECTOMY (N/A)  Patient Location: PACU  Anesthesia Type:General  Level of Consciousness: sedated  Airway & Oxygen Therapy: Patient Spontanous Breathing and Patient connected to face mask oxygen  Post-op Assessment: Report given to RN and Post -op Vital signs reviewed and stable  Post vital signs: Reviewed and stable  Last Vitals:  Filed Vitals:   08/04/15 0631 08/04/15 1133  BP: 134/82 145/82  Pulse: 59 55  Temp: 36.7 C 36.1 C  Resp: 19 18    Complications: No apparent anesthesia complications

## 2015-08-04 NOTE — Op Note (Signed)
Laparoscopic Cholecystectomy  Pre-operative Diagnosis: Acute cholecystitis  Post-operative Diagnosis: Acute cholecystitis  Procedure: Laparoscopic cholecystectomy  Surgeon: Adah Salvage. Excell Seltzer, MD FACS  Anesthesia: Gen. with endotracheal tube  Assistant: Surgical tech  Procedure Details  The patient was seen again in the Holding Room. The benefits, complications, treatment options, and expected outcomes were discussed with the patient. The risks of bleeding, infection, recurrence of symptoms, failure to resolve symptoms, bile duct damage, bile duct leak, retained common bile duct stone, bowel injury, any of which could require further surgery and/or ERCP, stent, or papillotomy were reviewed with the patient. The likelihood of improving the patient's symptoms with return to their baseline status is good.  The patient and/or family concurred with the proposed plan, giving informed consent.  The patient was taken to Operating Room, identified as Brian Walker and the procedure verified as Laparoscopic Cholecystectomy.  A Time Out was held and the above information confirmed.  Prior to the induction of general anesthesia, antibiotic prophylaxis was administered. VTE prophylaxis was in place. General endotracheal anesthesia was then administered and tolerated well. After the induction, the abdomen was prepped with Chloraprep and draped in the sterile fashion. The patient was positioned in the supine position.  Local anesthetic  was injected into the skin near the umbilicus and an incision made. The Veress needle was placed. Pneumoperitoneum was then created with CO2 and tolerated well without any adverse changes in the patient's vital signs. A 5mm port was placed in the periumbilical position and the abdominal cavity was explored.  Two 5-mm ports were placed in the right upper quadrant and a 12 mm epigastric port was placed all under direct vision. All skin incisions  were infiltrated with a local  anesthetic agent before making the incision and placing the trocars.   The patient was positioned  in reverse Trendelenburg, tilted slightly to the patient's left.  The gallbladder was identified, the fundus grasped and retracted cephalad. Adhesions were lysed bluntly. The infundibulum was grasped and retracted laterally, exposing the peritoneum overlying the triangle of Calot. This was then divided and exposed in a blunt fashion. Extensive blunt dissection was performed down along the infundibulum of the gallbladder down along a very long infundibulum to multiple impacted stones in the infundibulum above a small cystic duct.  Multiple lateral and medial vascularized adhesions were taken down either bluntly or with the use of double clipping when vessels were visible. It is was a heavy heavily vascularized omental encasement.  A critical view of the cystic duct and cystic artery was obtained.  The cystic duct was clearly identified and bluntly dissected.   The cystic artery was doubly clipped and divided into branches. Cystic duct was then doubly clipped and divided beneath or below a clearly impacted stone.  The gallbladder was taken from the gallbladder fossa in a retrograde fashion with the electrocautery. The gallbladder was removed and placed in an Endocatch bag. The liver bed was irrigated and inspected. Hemostasis was achieved with the electrocautery. Copious irrigation was utilized and was repeatedly aspirated until clear.  The gallbladder and Endocatch sac were then removed through the epigastric port site.   The port was enlarged to allow passage of the very large gallstone.  The area was irrigated again and there was some minimal non-surgical bleeding present from the omentum and the bed of the gallbladder therefore 2 pieces of Surgicel were placed. This area was observed for some time to ensure no further bleeding. None was noted.  Through a lateral  port site was placed 10 mm JP drain. This  could not be placed in the foramen of Winslow due to the inflammatory process and scar tissue present therefore was placed somewhat lateral to the gallbladder fossa beneath the liver. It was tied in with a 3-0 nylon. The drain was placed to bulb suction at the end of the case.  Inspection of the right upper quadrant was performed. No bleeding, bile duct injury or leak, or bowel injury was noted. Pneumoperitoneum was released.  The epigastric port site was closed with figure-of-eight 0 Vicryl sutures. 4-0 subcuticular Monocryl was used to close the skin. Steristrips and Mastisol and sterile dressings were  applied.  The patient was then extubated and brought to the recovery room in stable condition. Sponge, lap, and needle counts were correct at closure and at the conclusion of the case.   Findings: Acute cholecystitis with chronic Cholecystitis , extensive fibrotic-appearing tissue of omental encasement which was very heavily vascularized requiring multiple clips well away from the infundibulum of the gallbladder. There were multiple stones involving the infundibulum of the gallbladder.  Estimated Blood Loss: 50 cc         Drains: JP 1         Specimens: Gallbladder           Complications: none               Richard E. Excell Seltzerooper, MD, FACS

## 2015-08-04 NOTE — Anesthesia Preprocedure Evaluation (Signed)
Anesthesia Evaluation  Patient identified by MRN, date of birth, ID band Patient awake    Reviewed: Allergy & Precautions, H&P , NPO status , Patient's Chart, lab work & pertinent test results  History of Anesthesia Complications Negative for: history of anesthetic complications  Airway Mallampati: III  TM Distance: >3 FB Neck ROM: limited    Dental  (+) Poor Dentition, Chipped, Missing   Pulmonary neg shortness of breath, COPD, Current Smoker,    Pulmonary exam normal breath sounds clear to auscultation       Cardiovascular Exercise Tolerance: Good (-) angina(-) Past MI and (-) DOE negative cardio ROS Normal cardiovascular exam Rhythm:regular Rate:Normal     Neuro/Psych negative neurological ROS  negative psych ROS   GI/Hepatic Neg liver ROS, GERD  Controlled,  Endo/Other  negative endocrine ROS  Renal/GU negative Renal ROS  negative genitourinary   Musculoskeletal   Abdominal   Peds  Hematology negative hematology ROS (+)   Anesthesia Other Findings History reviewed. No pertinent past medical history.  Past Surgical History:   KNEE SURGERY                                    Bilateral              KNEE SURGERY                                                    Comment:bilateral  BMI    Body Mass Index   22.36 kg/m 2      Reproductive/Obstetrics negative OB ROS                             Anesthesia Physical Anesthesia Plan  ASA: III  Anesthesia Plan: General ETT and Rapid Sequence   Post-op Pain Management:    Induction:   Airway Management Planned:   Additional Equipment:   Intra-op Plan:   Post-operative Plan:   Informed Consent: I have reviewed the patients History and Physical, chart, labs and discussed the procedure including the risks, benefits and alternatives for the proposed anesthesia with the patient or authorized representative who has indicated his/her  understanding and acceptance.   Dental Advisory Given  Plan Discussed with: Anesthesiologist, CRNA and Surgeon  Anesthesia Plan Comments:         Anesthesia Quick Evaluation

## 2015-08-04 NOTE — Progress Notes (Signed)
CC: Acute cholecystitis Subjective: This patient admitted with acute cholecystitis and right upper quadrant pain his pain is improved his nausea has abated except when he gets time allotted and his electrolytes have been partially corrected but are now and a safe area for surgery. He denies any jaundice or acholic stools and no fevers or chills his blood pressure has been modulated as well.  Objective: Vital signs in last 24 hours: Temp:  [98 F (36.7 C)-98.4 F (36.9 C)] 98 F (36.7 C) (01/10 0631) Pulse Rate:  [51-77] 59 (01/10 0631) Resp:  [16-24] 19 (01/10 0631) BP: (134-201)/(80-107) 134/82 mmHg (01/10 0631) SpO2:  [94 %-100 %] 94 % (01/10 0631) Weight:  [150 lb (68.04 kg)-151 lb 8 oz (68.72 kg)] 151 lb 8 oz (68.72 kg) (01/09 1816) Last BM Date: 08/02/15  Intake/Output from previous day: 01/09 0701 - 01/10 0700 In: 759 [I.V.:759] Out: 200 [Urine:200] Intake/Output this shift: Total I/O In: 1114.3 [I.V.:1114.3] Out: -   Physical exam:  Blood pressure improved afebrile No scleral icterus no jaundice Abdomen is tense and minimally tender in the right upper quadrant with a questionable Murphy sign Abs are nontender   Lab Results: CBC   Recent Labs  08/03/15 1051 08/04/15 0409  WBC 14.0* 16.8*  HGB 15.4 15.4  HCT 45.3 46.0  PLT 220 211   BMET  Recent Labs  08/03/15 1051 08/04/15 0409  NA 135 136  K 2.7* 3.1*  CL 92* 101  CO2 28 30  GLUCOSE 192* 131*  BUN 9 10  CREATININE 0.85 0.91  CALCIUM 9.9 9.0   PT/INR No results for input(s): LABPROT, INR in the last 72 hours. ABG No results for input(s): PHART, HCO3 in the last 72 hours.  Invalid input(s): PCO2, PO2  Studies/Results: Ct Abdomen Pelvis W Contrast  08/03/2015  CLINICAL DATA:  Abdominal pain for 1 day with nausea and vomiting EXAM: CT ABDOMEN AND PELVIS WITH CONTRAST TECHNIQUE: Multidetector CT imaging of the abdomen and pelvis was performed using the standard protocol following bolus  administration of intravenous contrast. CONTRAST:  OMNIPAQUE IOHEXOL 300 MG/ML  SOLN COMPARISON:  None. FINDINGS: Lower chest: There is bibasilar lung atelectatic change, more on the right than on the left. Hepatobiliary: There is a cyst in the dome of the liver on the right measuring 1.5 x 1.3 cm. There is a probable cyst in the anterior segment of the right lobe of the liver medially measuring 8 x 6 mm. There is a tiny probable cyst in the anterior segment right lobe of the liver midportion. There is slight fatty infiltration in the liver near the fissure for the ligamentum teres. Within the gallbladder, there is sludge. The gallbladder wall appears mildly ill-defined with questionable pericholecystic fluid. There is no appreciable biliary duct dilatation. Pancreas: No mass or inflammatory focus. Spleen: No splenic lesions are identified. Adrenals/Urinary Tract: Adrenals appear normal bilaterally. There is a cyst in the anterior aspect of the mid right kidney measuring 8 x 6 mm. There is a cyst in the mid right kidney measuring 9 x 7 mm. There is a probable nearby cyst measuring 4 x 5 mm. There is a cyst arising from the periphery of the left kidney measuring 7 x 6 mm. There is no hydronephrosis on either side. There is no appreciable renal or ureteral calculus on either side. The wall of the urinary bladder is mildly thickened in a generalized manner. Stomach/Bowel: Stomach is distended with fluid. There is no appreciable bowel wall or mesenteric  thickening. There is no bowel obstruction. No free air or portal venous air. There are occasional sigmoid diverticula without diverticulitis. Vascular/Lymphatic: There is atherosclerotic calcification in the aorta. There is no abdominal aortic aneurysm. The major mesenteric vessels appear patent. There is no appreciable adenopathy in the abdomen or pelvis. Reproductive: Prostate and seminal vesicles appear within normal limits. There is no pelvic mass or pelvic  fluid collection. Other: There is no periappendiceal region inflammation. There is no abscess or ascites in the abdomen or pelvis. Musculoskeletal: There is degenerative change in the lumbar spine with disc space narrowing most marked at L3-4 and L4-5 but also moderate at L2-3. There are no blastic or lytic bone lesions. 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 No intramuscular or abdominal wall lesions are identified. IMPRESSION: There is sludge in the gallbladder. Gallbladder appears rather distended with an ill-defined wall and suspected pericholecystic fluid. These findings are concerning for acute cholecystitis. There is thickening of the urinary bladder wall. Question a degree of cystitis. No bowel obstruction. No abscess. No periappendiceal region inflammation. No renal or ureteral calculus. No hydronephrosis. There are occasional sigmoid diverticula without diverticulitis. Electronically Signed   By: Bretta BangWilliam  Woodruff III M.D.   On: 08/03/2015 14:06    Anti-infectives: Anti-infectives    Start     Dose/Rate Route Frequency Ordered Stop   08/03/15 1545  levofloxacin (LEVAQUIN) IVPB 750 mg     750 mg 100 mL/hr over 90 Minutes Intravenous Every 24 hours 08/03/15 1536     08/03/15 1430  ciprofloxacin (CIPRO) IVPB 400 mg     400 mg 200 mL/hr over 60 Minutes Intravenous  Once 08/03/15 1418 08/03/15 1620      Assessment/Plan:  Abs are personally reviewed. Recommend proceeding with laparoscopic cholecystectomy today as his electrolytes have improved and his blood pressure is improved. The rationale for this been discussed the options of observation were reviewed the risk bleeding infection recurrence negative laparoscopy and conversion to an open procedure with its risks of bile duct injury or bowel injury were all reviewed the need for further further surgery were reviewed as well he understood and agreed to proceed present for this discussion.  Lattie Hawichard E Providencia Hottenstein, MD,  FACS  08/04/2015

## 2015-08-04 NOTE — Anesthesia Procedure Notes (Signed)
Procedure Name: Intubation Performed by: Malva CoganBEANE, Letonya Mangels Pre-anesthesia Checklist: Patient identified, Emergency Drugs available, Suction available, Patient being monitored and Timeout performed Patient Re-evaluated:Patient Re-evaluated prior to inductionOxygen Delivery Method: Circle system utilized Preoxygenation: Pre-oxygenation with 100% oxygen Intubation Type: IV induction and Rapid sequence

## 2015-08-05 ENCOUNTER — Encounter: Payer: Self-pay | Admitting: Surgery

## 2015-08-05 LAB — CBC WITH DIFFERENTIAL/PLATELET
Basophils Absolute: 0.1 10*3/uL (ref 0–0.1)
Basophils Relative: 1 %
EOS ABS: 0 10*3/uL (ref 0–0.7)
EOS PCT: 0 %
HCT: 38.3 % — ABNORMAL LOW (ref 40.0–52.0)
Hemoglobin: 12.8 g/dL — ABNORMAL LOW (ref 13.0–18.0)
LYMPHS ABS: 1.7 10*3/uL (ref 1.0–3.6)
Lymphocytes Relative: 12 %
MCH: 29.6 pg (ref 26.0–34.0)
MCHC: 33.5 g/dL (ref 32.0–36.0)
MCV: 88.5 fL (ref 80.0–100.0)
Monocytes Absolute: 1.1 10*3/uL — ABNORMAL HIGH (ref 0.2–1.0)
Monocytes Relative: 8 %
Neutro Abs: 10.9 10*3/uL — ABNORMAL HIGH (ref 1.4–6.5)
Neutrophils Relative %: 79 %
PLATELETS: 181 10*3/uL (ref 150–440)
RBC: 4.33 MIL/uL — ABNORMAL LOW (ref 4.40–5.90)
RDW: 15.3 % — AB (ref 11.5–14.5)
WBC: 13.8 10*3/uL — AB (ref 3.8–10.6)

## 2015-08-05 LAB — COMPREHENSIVE METABOLIC PANEL
ALBUMIN: 3.1 g/dL — AB (ref 3.5–5.0)
ALT: 20 U/L (ref 17–63)
AST: 34 U/L (ref 15–41)
Alkaline Phosphatase: 53 U/L (ref 38–126)
Anion gap: 3 — ABNORMAL LOW (ref 5–15)
BUN: 14 mg/dL (ref 6–20)
CHLORIDE: 102 mmol/L (ref 101–111)
CO2: 29 mmol/L (ref 22–32)
Calcium: 8.5 mg/dL — ABNORMAL LOW (ref 8.9–10.3)
Creatinine, Ser: 1.01 mg/dL (ref 0.61–1.24)
GFR calc Af Amer: >60 mL/min (ref >60)
Glucose, Bld: 152 mg/dL — ABNORMAL HIGH (ref 65–99)
POTASSIUM: 3.7 mmol/L (ref 3.5–5.1)
SODIUM: 134 mmol/L — AB (ref 135–145)
Total Bilirubin: 0.7 mg/dL (ref 0.3–1.2)
Total Protein: 5.9 g/dL — ABNORMAL LOW (ref 6.5–8.1)

## 2015-08-05 NOTE — Progress Notes (Addendum)
Alert and oriented. Vss. No sign of acute distress. Anxious to go home  due to mom passed away at hospice home.  JP drain removed by Dr. Excell Seltzerooper. Dressing to surgical sites dry and intact.

## 2015-08-05 NOTE — Discharge Summary (Signed)
Physician Discharge Summary  Patient ID: Brian ShuckWilliam C Walker MRN: [id number] DOB/AGE: 57/10/1960 57 y.o.  Admit date: 08/03/2015 Discharge date: 08/05/2015   Discharge Diagnoses:  Active Problems:   Acute cholecystitis   Procedures: Laparoscopic cholecystectomy  Hospital Course: This patient who was admitted the hospital nausea vomiting and abdominal pain his potassium was low and this was corrected he was started on antibiotics and then taken to the operating room for laparoscopic cholecystectomy which confirmed the diagnosis of acute on chronic cholecystitis. He made and on, K to postoperative recovery has drained showed no sign of bile leak therefore it has been removed he is discharged in stable condition on a regular diet to follow-up in my office in 10 days. He was counseled about staying 1 extra day on IV antibiotics but he wants to go home today because his mother is in hospice and he wants to see her.  Consults: None  Disposition: [facility] Not Defined     Medication List    TAKE these medications        ciprofloxacin 500 MG tablet  Commonly known as:  CIPRO  Take 1 tablet (500 mg total) by mouth 2 (two) times daily.     ibuprofen 200 MG tablet  Commonly known as:  ADVIL,MOTRIN  Take 600 mg by mouth every 6 (six) hours as needed for headache or mild pain.     metoprolol tartrate 25 MG tablet  Commonly known as:  LOPRESSOR  Take 1 tablet (25 mg total) by mouth 2 (two) times daily.     oxyCODONE-acetaminophen 5-325 MG tablet  Commonly known as:  PERCOCET/ROXICET  Take 1 tablet by mouth every 4 (four) hours as needed for moderate pain.         Lattie Hawichard E Markisha Meding, MD, FACS

## 2015-08-05 NOTE — Progress Notes (Signed)
1 Day Post-Op  Subjective: Status post laparoscopic cholecystectomy. Patient is doing well tolerating a diet and wants to go home. He has no nausea vomiting fevers or chills.  Objective: Vital signs in last 24 hours: Temp:  [96.9 F (36.1 C)-98.5 F (36.9 C)] 98.2 F (36.8 C) (01/11 0458) Pulse Rate:  [55-81] 56 (01/11 0458) Resp:  [13-18] 18 (01/11 0458) BP: (109-146)/(68-90) 138/88 mmHg (01/11 0458) SpO2:  [93 %-100 %] 96 % (01/11 0458) Last BM Date: 08/02/15  Intake/Output from previous day: 01/10 0701 - 01/11 0700 In: 4543.6 [I.V.:4543.6] Out: 730 [Urine:325; Drains:85; Blood:20] Intake/Output this shift: Total I/O In: 1025 [P.O.:750; I.V.:275] Out: 20 [Drains:20]  Physical exam:  No icterus no jaundice soft nontender abdomen wounds clean no bile in drain. Drain is removed. Nontender calves.  Lab Results: CBC   Recent Labs  08/04/15 0409 08/05/15 0654  WBC 16.8* 13.8*  HGB 15.4 12.8*  HCT 46.0 38.3*  PLT 211 181   BMET  Recent Labs  08/04/15 0409 08/05/15 0654  NA 136 134*  K 3.1* 3.7  CL 101 102  CO2 30 29  GLUCOSE 131* 152*  BUN 10 14  CREATININE 0.91 1.01  CALCIUM 9.0 8.5*   PT/INR No results for input(s): LABPROT, INR in the last 72 hours. ABG No results for input(s): PHART, HCO3 in the last 72 hours.  Invalid input(s): PCO2, PO2  Studies/Results: Ct Abdomen Pelvis W Contrast  08/03/2015  CLINICAL DATA:  Abdominal pain for 1 day with nausea and vomiting EXAM: CT ABDOMEN AND PELVIS WITH CONTRAST TECHNIQUE: Multidetector CT imaging of the abdomen and pelvis was performed using the standard protocol following bolus administration of intravenous contrast. CONTRAST:  OMNIPAQUE IOHEXOL 300 MG/ML  SOLN COMPARISON:  None. FINDINGS: Lower chest: There is bibasilar lung atelectatic change, more on the right than on the left. Hepatobiliary: There is a cyst in the dome of the liver on the right measuring 1.5 x 1.3 cm. There is a probable cyst in the  anterior segment of the right lobe of the liver medially measuring 8 x 6 mm. There is a tiny probable cyst in the anterior segment right lobe of the liver midportion. There is slight fatty infiltration in the liver near the fissure for the ligamentum teres. Within the gallbladder, there is sludge. The gallbladder wall appears mildly ill-defined with questionable pericholecystic fluid. There is no appreciable biliary duct dilatation. Pancreas: No mass or inflammatory focus. Spleen: No splenic lesions are identified. Adrenals/Urinary Tract: Adrenals appear normal bilaterally. There is a cyst in the anterior aspect of the mid right kidney measuring 8 x 6 mm. There is a cyst in the mid right kidney measuring 9 x 7 mm. There is a probable nearby cyst measuring 4 x 5 mm. There is a cyst arising from the periphery of the left kidney measuring 7 x 6 mm. There is no hydronephrosis on either side. There is no appreciable renal or ureteral calculus on either side. The wall of the urinary bladder is mildly thickened in a generalized manner. Stomach/Bowel: Stomach is distended with fluid. There is no appreciable bowel wall or mesenteric thickening. There is no bowel obstruction. No free air or portal venous air. There are occasional sigmoid diverticula without diverticulitis. Vascular/Lymphatic: There is atherosclerotic calcification in the aorta. There is no abdominal aortic aneurysm. The major mesenteric vessels appear patent. There is no appreciable adenopathy in the abdomen or pelvis. Reproductive: Prostate and seminal vesicles appear within normal limits. There is no pelvic  mass or pelvic fluid collection. Other: There is no periappendiceal region inflammation. There is no abscess or ascites in the abdomen or pelvis. Musculoskeletal: There is degenerative change in the lumbar spine with disc space narrowing most marked at L3-4 and L4-5 but also moderate at L2-3. There are no blastic or lytic bone lesions. There is  osteoarthritic change in each hip joint region. No intramuscular or abdominal wall lesions are identified. IMPRESSION: There is sludge in the gallbladder. Gallbladder appears rather distended with an ill-defined wall and suspected pericholecystic fluid. These findings are concerning for acute cholecystitis. There is thickening of the urinary bladder wall. Question a degree of cystitis. No bowel obstruction. No abscess. No periappendiceal region inflammation. No renal or ureteral calculus. No hydronephrosis. There are occasional sigmoid diverticula without diverticulitis. Electronically Signed   By: Bretta BangWilliam  Woodruff III M.D.   On: 08/03/2015 14:06    Anti-infectives: Anti-infectives    Start     Dose/Rate Route Frequency Ordered Stop   08/04/15 0000  ciprofloxacin (CIPRO) 500 MG tablet     500 mg Oral 2 times daily 08/04/15 1606     08/03/15 1545  levofloxacin (LEVAQUIN) IVPB 750 mg     750 mg 100 mL/hr over 90 Minutes Intravenous Every 24 hours 08/03/15 1536     08/03/15 1430  ciprofloxacin (CIPRO) IVPB 400 mg     400 mg 200 mL/hr over 60 Minutes Intravenous  Once 08/03/15 1418 08/03/15 1620      Assessment/Plan: s/p Procedure(s): LAPAROSCOPIC CHOLECYSTECTOMY   She doing very well drain is been removed recommend discharge and follow-up in 10 days.  Lattie Hawichard E Cooper, MD, FACS  08/05/2015

## 2015-08-10 ENCOUNTER — Telehealth: Payer: Self-pay

## 2015-08-10 NOTE — Telephone Encounter (Signed)
Patient called stating that he had surgery on 08/04/2015 (Laparoscopic Cholecystectomy-Dr. Excell Seltzerooper). He stated that after surgery his mother passed away and he was the one taking care of all the funeral arrangements. He stated that he over did it and is now feeling the pain. He stated that he has been taking his pain medication every four hours due to the pain. He asked for a refill even though he knows that he has a post-op appointment on Friday. I told him that I would call our on call surgeon and ask for a refill. He agreed and understood.

## 2015-08-10 NOTE — Telephone Encounter (Signed)
Called patient back to let him know that I contacted Dr. Marylynn PearsonLoflin-our on call surgeon and asked her for a refill on his pain medication. Dr. Orvis BrillLoflin recommended for him to start taking Ibuprofen 600 MG every 6 hours and Tylenol in between.  I then tried to explain to patient what her recommendations were. He stated that he was the one in pain and did not agree in Dr. Elease EtienneLoflin's decision. He stated that if there was any way to get in contact with Dr. Excell Seltzerooper since he was his surgeon and ask him for a refill. I told him that I would. I also told him that I would probably get in touch with him until Wednesday and he agreed.

## 2015-08-11 ENCOUNTER — Telehealth: Payer: Self-pay

## 2015-08-11 NOTE — Telephone Encounter (Signed)
Patient called back to ask me a question. He stated that if a patient is not able to ask for a refill on their pain medication, then why would their pill bottle say "refill-prior-authorization" instead of saying no refill. He stated that he did not agree with Dr. Elease Etienne decision on just recommending Ibuprofen and Acetaminophen for his pain. He stated that if a patient that just had surgery he/she could be able to get some extra pain medications until seen by their surgeon. Patient also stated that he will let Dr. Excell Seltzer know of everything that has been happening. Patient wanted me to get in contact with Dr. Excell Seltzer and ask him for a refill on his pain medication. I told him that he would be on call tomorrow and that I would ask him if he could write him a prescription for his pain medication. Patient stated that he would truly appreciate it if I did. I told him that I would have to call him tomorrow until I got the chance to speak with Dr. Excell Seltzer. Patient agreed.

## 2015-08-11 NOTE — Telephone Encounter (Signed)
Dr. 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 So because she thinks that he might have this, she will send it out to [facility] to be re-evaluated. Dr. Excell Seltzer wanted me to tell Dr. Excell Seltzer so I will send him an e-mail. Patient had a Laparoscopic Cholecystectomy by Dr. Excell Seltzer on 08/04/2015.

## 2015-08-12 MED ORDER — OXYCODONE-ACETAMINOPHEN 5-325 MG PO TABS: 1.0000 | ORAL_TABLET | ORAL | Status: DC | PRN

## 2015-08-12 NOTE — Telephone Encounter (Signed)
Spoke with Dr. Excell Seltzer. He would like medication refilled to just get patient through to appointment on 1/20. Dr. Orvis Brill has signed prescription as she is in office today.   Called to let patient know that his prescription is ready for pick up in the [facility] office at this time. No answer. Left voicemail asking for return phone call.

## 2015-08-14 ENCOUNTER — Ambulatory Visit (INDEPENDENT_AMBULATORY_CARE_PROVIDER_SITE_OTHER): Payer: Self-pay | Admitting: Surgery

## 2015-08-14 ENCOUNTER — Encounter: Payer: Self-pay | Admitting: Surgery

## 2015-08-14 VITALS — BP 149/117 | HR 65 | Temp 98.5°F | Wt 147.0 lb

## 2015-08-14 DIAGNOSIS — K8000 Calculus of gallbladder with acute cholecystitis without obstruction: Secondary | ICD-10-CM

## 2015-08-14 NOTE — Progress Notes (Signed)
This a patient status post laparoscopic cholecystectomy for acute cholecystitis with chronic changes. At the time of surgery it was noted that he had a tremendously fibrotic area.  Been notified concerning pathology report showing some dysplasia and at the report has been sent to [facility] for review of the slides.  Patient describes some postprandial discomfort in his right upper quadrant and some nausea that is vague and does not follow any sort of pattern. He has lost some weight since the surgery.  Abdomen is soft and nontender wounds are healing well without erythema or drainage.  Will await pathology report and see the patient in 2-3 weeks to discuss the pathology findings once they are available. I discussed with the patient the potential for this being a cancer versus something benign but will await the final report.

## 2015-08-19 LAB — SURGICAL PATHOLOGY

## 2015-09-18 ENCOUNTER — Encounter: Payer: Self-pay | Admitting: Surgery

## 2015-09-18 ENCOUNTER — Ambulatory Visit (INDEPENDENT_AMBULATORY_CARE_PROVIDER_SITE_OTHER): Payer: Self-pay | Admitting: Surgery

## 2015-09-18 VITALS — BP 153/112 | HR 63 | Temp 98.2°F | Ht 69.0 in | Wt 151.2 lb

## 2015-09-18 DIAGNOSIS — K8000 Calculus of gallbladder with acute cholecystitis without obstruction: Secondary | ICD-10-CM

## 2015-09-18 NOTE — Progress Notes (Signed)
This patient status post laparoscopic cholecystectomy for acute cholecystitis. He states feeling much better and is back to normal with the exception of some pulmonary issues and he is a smoker he's also had some exposure to mold and mildew recently. He also states that he had an abnormal chest x-ray last summer and never had a CT scan for follow-up.  Of note the patient had questionable dysplasia on his pathology specimen and his gallbladder pathology was sent to [facility] for second opinion it returned showing benign condition.  Physical exam shows comfortable male patient soft nontender CTA RR wounds healed no erythema  Abdominal CT scan did not take cuts high enough to visualize any abnormality along these were personally reviewed in retrospect. His chest x-rays from June and August 2016 showed an abnormality in the right lung and a CT scan was recommended at that time. Patient does not have a primary care physician. I will order a chest CT and call him with results and arrange for him to see a primary care physician for his ongoing pulmonary issues as well as follow up on the CT scan as well as blood pressure medication and general health.

## 2015-09-18 NOTE — Patient Instructions (Addendum)
Please call our office with any questions or concerns.  We have ordered a Chest CT Scan to check the lung nodules that you were concerned about from 2016 Chest X-ray. These orders have been placed. Central scheduling will be calling you to get this scheduled. If you haven't heard from scheduling in 3 business days, please call them at [phone].  We also need to get you set-up with a Primary Care Physician to take care of your High Blood Pressure as well as follow-up on the Chest CT. I will call you as soon as I have an appointment for you.

## 2015-10-02 ENCOUNTER — Encounter: Payer: Self-pay | Admitting: Surgery

## 2015-10-13 ENCOUNTER — Telehealth: Payer: Self-pay

## 2015-10-13 NOTE — Telephone Encounter (Signed)
-----   Message from Harvel QualeAmber Carolena Fairbank, RN sent at 10/03/2015  5:50 PM EST ----- Patient's CT never scheduled. Looks like scheduling called multiple times. Needs call made to patient about this.  Needs appointment with PCP also. Looks into this.

## 2015-10-13 NOTE — Telephone Encounter (Signed)
Call made to patient at this time regarding CT Scan scheduling. No answer. Left message for return phone call.  Call also made to Emergency Contact, Rowe PavyAngela Piper. No answer. Left voicemail for return phone call at that number as well.

## 2015-10-16 NOTE — Telephone Encounter (Signed)
Call made to patient and Emergency Contact once again at this time. No answer on either number. Left voicemail for return phone call on both numbers.  Dr. Excell Seltzerooper notified that CT was never scheduled.

## 2015-10-19 ENCOUNTER — Ambulatory Visit
Admission: EM | Admit: 2015-10-19 | Discharge: 2015-10-19 | Disposition: A | Discharge status: Home / Self Care | Payer: MEDICAID | Attending: Emergency Medicine | Admitting: Emergency Medicine

## 2015-10-19 ENCOUNTER — Encounter: Payer: Self-pay | Admitting: Emergency Medicine

## 2015-10-19 DIAGNOSIS — M25561 Pain in right knee: Secondary | ICD-10-CM

## 2015-10-19 DIAGNOSIS — M7121 Synovial cyst of popliteal space [Baker], right knee: Secondary | ICD-10-CM

## 2015-10-19 MED ORDER — DICLOFENAC SODIUM 75 MG PO TBEC
75.0000 mg | DELAYED_RELEASE_TABLET | Freq: Two times a day (BID) | ORAL | Status: DC
Start: 2015-10-19 — End: 2016-12-05

## 2015-10-19 MED ORDER — TRAMADOL HCL 50 MG PO TABS: ORAL_TABLET | ORAL | Status: DC

## 2015-10-19 NOTE — Discharge Instructions (Signed)
Try to tramadol. It may not make you nauseous. Stop taking it if it makes the nauseous or if you vomit.  if your lips start swelling or you have difficulty breathing- go immediately to the ER for thi.. Continue the compression sleeve, ice, rest. Take the diclofenac twice a day. Follow-up with [facility] in a week if you are not getting better. Go to the ER for the signs and symptoms we discussed. Also follow up with one of the providers listed. They are primary care physicians who are accepting new patients.

## 2015-10-19 NOTE — ED Notes (Signed)
Patient c/o right knee pain and swelling since last Wed.  Patient denies injury.

## 2015-10-19 NOTE — ED Provider Notes (Signed)
HPI  SUBJECTIVE:  Brian Walker is a 55 y.o. male who presents with 5 days of right  entire knee pain, swelling. states that he had swelling anteriorly, but also felt a mass posteriorly in the popliteal fossa.   reports erythema, increased temperature, which have largely improved. States his symptoms are worse in the evening after crawling on his knees all day, bending his knees, going from sitting to standing, putting torque on it, hyperflexion. States the pain is okay with walking, and better with rest. He states that his knee has been significantly better since not working over the weekend.  does a lot of crawling on his knees as part of his job. He has tried Tylenol, rest, compression sleeve with improvement in his symptoms. no nausea, vomiting, fevers, bruising, rash, swelling, distal numbness or tingling, weakness. No anticoagulant use. No antipyretic in past 4-6 hours. He has a history of unknown right knee surgery, gallbladder surgery. No history of gout, diabetes, HIV, IVDU, DVT, hypercoagulability. PMD: None.    Past Medical History  Diagnosis Date  . Hypertension     Past Surgical History  Procedure Laterality Date  . Knee surgery Bilateral   . Knee surgery      bilateral  . Cholecystectomy N/A 08/04/2015    Procedure: LAPAROSCOPIC CHOLECYSTECTOMY;  Surgeon: Lattie Haw, MD;  Location: [facility] ORS;  Service: General;  Laterality: N/A;    Family History  Problem Relation Age of Onset  . Parkinson's disease Mother   . Cancer Father   . Cancer Sister   . Lupus Sister     Social History  Substance Use Topics  . Smoking status: Current Every Day Smoker -- 0.50 packs/day    Types: Cigarettes  . Smokeless tobacco: Never Used  . Alcohol Use: No    No current facility-administered medications for this encounter.  Current outpatient prescriptions:  .  diclofenac (VOLTAREN) 75 MG EC tablet, Take 1 tablet (75 mg total) by mouth 2 (two) times daily. Take with food, Disp: 30  tablet, Rfl: 0 .  metoprolol tartrate (LOPRESSOR) 25 MG tablet, Take 1 tablet (25 mg total) by mouth 2 (two) times daily., Disp: 30 tablet, Rfl: 2 .  traMADol (ULTRAM) 50 MG tablet, 1-2 tabs po q 6 hr prn pain Maximum dose= 8 tablets per day, Disp: 20 tablet, Rfl: 0  Allergies  Allergen Reactions  . Codeine Nausea And Vomiting and Rash  . Penicillins Nausea And Vomiting, Swelling, Rash and Other (See Comments)    Pt states that this medication caused his throat to swell.   Has patient had a PCN reaction causing immediate rash, facial/tongue/throat swelling, SOB or lightheadedness with hypotension: Yes Has patient had a PCN reaction causing severe rash involving mucus membranes or skin necrosis: No Has patient had a PCN reaction that required hospitalization No Has patient had a PCN reaction occurring within the last 10 years: No If all of the above answers are "NO", then may proceed with Cephalosporin use.     ROS  As noted in HPI.   Physical Exam  BP 154/99 mmHg  Pulse 68  Temp(Src) 97 F (36.1 C) (Tympanic)  Resp 16  Ht  (1.753 m)  Wt 150 lb (68.04 kg)  BMI 22.14 kg/m2  SpO2 99%  Constitutional: Well developed, well nourished, no acute distress Eyes:  EOMI, conjunctiva normal bilaterally HENT: Normocephalic, atraumatic,mucus membranes moist Respiratory: Normal inspiratory effort Cardiovascular: Normal rate GI: nondistended skin: No rash, skin intact Musculoskeletal: Knee normal color,  no increased temperature. Trace effusion. Minimal swelling of the joint. Knee ROM baseline for PT , Flexion  Intact flexion to 90 degrees, pain posteriorly with hyperflexion.  Patella NT, Patellar apprehension test negative, Patellar tendon NT, Medial joint NT, Lateral joint NT, Popliteal region NT, no appreciable  popliteal fossa swelling or asymmetry on visual inspection bilaterally, no appreciable mass posteriorly,  Lachman's stable, Varus LCL stress testing stable, Valgus MCL stress  testing stable, McMurray's testing normal, distal NVI with intact baseline sensation / motor / pulse distal to knee affected extremity. Calves symmetric, nontender. Neurologic: Alert & oriented x 3, no focal neuro deficits Psychiatric: Speech and behavior appropriate   ED Course   Medications - No data to display  No orders of the defined types were placed in this encounter.    No results found for this or any previous visit (from the past 24 hour(s)). No results found.  ED Clinical Impression  Baker cyst, right  Right knee pain   ED Assessment/Plan  In The absence of trauma or bony tenderness, and do not feel that imaging is indicated at this time. No evidence of gout, DVT, septic joint. Presentation consistent with overuse, and a history of a Baker cyst that has resolved.  Baker cyst rupture in the ddx but suspect that knee would still be swollen. No pulsatile mass consistent with a popliteal aneurysm felt.  We'll continue knee sleeve, diclofenac, tramadol. Giving orthopedic referral. Patient to follow-up with them if no better. Discussed signs and symptoms that should prompt return to emergency room. Patient agrees with plan  *This clinic note was created using Dragon dictation software. Therefore, there may be occasional mistakes despite careful proofreading.  ?   Domenick GongAshley Welles Walthall, MD 10/20/15 1054

## 2016-12-05 ENCOUNTER — Ambulatory Visit (INDEPENDENT_AMBULATORY_CARE_PROVIDER_SITE_OTHER): Payer: BLUE CROSS/BLUE SHIELD

## 2016-12-05 ENCOUNTER — Ambulatory Visit
Admission: EM | Admit: 2016-12-05 | Discharge: 2016-12-05 | Disposition: A | Discharge status: Home / Self Care | Payer: BLUE CROSS/BLUE SHIELD | Attending: Family Medicine | Admitting: Family Medicine

## 2016-12-05 DIAGNOSIS — S8011XA Contusion of right lower leg, initial encounter: Secondary | ICD-10-CM | POA: Diagnosis not present

## 2016-12-05 DIAGNOSIS — S81811A Laceration without foreign body, right lower leg, initial encounter: Secondary | ICD-10-CM | POA: Diagnosis not present

## 2016-12-05 MED ORDER — MUPIROCIN 2 % EX OINT: 1.0000 "application " | TOPICAL_OINTMENT | Freq: Three times a day (TID) | CUTANEOUS | 0 refills | Status: DC

## 2016-12-05 NOTE — ED Triage Notes (Signed)
Patient complains of right ankle pain after having a metal breaker panel fall on his ankle x 10 days. Patient states that ankle is still painful to walk on and swelling. Patient states that he thought this was going to get better. Patient states that he has been noticing that swelling gets worse the more he is on the ankle.

## 2016-12-05 NOTE — ED Provider Notes (Signed)
CSN: [id number]     Arrival date & time 12/05/16  0806 History   First MD Initiated Contact with Patient 12/05/16 [phone]     Chief Complaint  Patient presents with  . Ankle Pain    Right   (Consider location/radiation/quality/duration/timing/severity/associated sxs/prior Treatment) HPI  This a 57 year old male who presents with right distal tibial pain. He states that this happened approximately 10 days ago. 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 He states that the panel weight approximately 80 pounds. He sustained a laceration but did not seek medical attention. He placed Neosporin on the wound since that time. 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 Since that time he has noticed  swelling with any prolonged standing. He does not have an antalgic gait. Is concerned that the laceration was becoming infected because of a white debris noticed in the wound itself. He continues to have pain over the distal tibia.         Past Medical History:  Diagnosis Date  . Hypertension    Past Surgical History:  Procedure Laterality Date  . CHOLECYSTECTOMY N/A 08/04/2015   Procedure: LAPAROSCOPIC CHOLECYSTECTOMY;  Surgeon: Lattie Haw, MD;  Location: [facility] ORS;  Service: General;  Laterality: N/A;  . KNEE SURGERY Bilateral   . KNEE SURGERY     bilateral   Family History  Problem Relation Age of Onset  . Parkinson's disease Mother   . Cancer Father   . Cancer Sister   . Lupus Sister    Social History  Substance Use Topics  . Smoking status: Current Every Day Smoker   Packs/day: 1.00    Types: Cigarettes  . Smokeless tobacco: Never Used  . Alcohol use No    Review of Systems  Constitutional: Positive for activity change. Negative for appetite change, chills, fatigue and fever.  Musculoskeletal: Negative for gait problem, joint swelling and myalgias.  Skin: Positive for wound.  All other systems reviewed and are negative.   Allergies  Codeine and Penicillins  Home Medications   Prior to Admission medications   Medication Sig Start Date End Date Taking? Authorizing Provider  mupirocin ointment (BACTROBAN) 2 % Apply 1 application topically 3 (three) times daily. 12/05/16   Lutricia Feil, PA-C   Meds Ordered and Administered this Visit  Medications - No data to display  BP (!) 143/59 (BP Location: Left Arm)   Pulse 60   Temp 98.2 F (36.8 C) (Oral)   Resp 16   Ht 5\' 9"  (1.753 m)   Wt 155 lb (70.3 kg)   SpO2 98%   BMI 22.89 kg/m  No data found.   Physical Exam  Constitutional: He is oriented to person, place, and time. He appears well-developed and well-nourished. No distress.  HENT:  Head: Normocephalic and atraumatic.  Eyes: Pupils are equal, round, and reactive to light.  Neck: Normal range of motion.  Musculoskeletal: Normal range of motion. He exhibits tenderness. He exhibits no edema.  Examination of the right lower extremity shows a  longitudinally oriented healing laceration approximately 3-1/2 cm in length with surrounding erythema and induration and  Granulation tissue in the midportion of the wound itself. Refer to photo for detail. He has good range of motion of his ankle. He has some tenderness just inferior to the wound over the tibia. He has no ligamentous instability or tenderness. Foot is uninvolved.  Neurological: He is alert and oriented to person, place, and time.  Skin: Skin is warm and dry. He is not diaphoretic.  Psychiatric: He has a normal mood and affect. His behavior is normal. Thought content normal.    Nursing note and vitals reviewed.         Urgent Care Course     Procedures (including critical care time)  Labs Review Labs Reviewed - No data to display  Imaging Review Dg Ankle Complete Right  Result Date: 12/05/2016 CLINICAL DATA:  Dropped heavy object on distal lower extremity EXAM: RIGHT ANKLE - COMPLETE 3+ VIEW COMPARISON:  None. FINDINGS: Frontal, oblique, and lateral views obtained. There is mild soft tissue swelling. No fracture or joint effusion. Ankle mortise appears intact. No appreciable joint space narrowing. IMPRESSION: Mild soft tissue swelling. No fracture or arthropathy. Ankle mortise appears intact. Electronically Signed   By: Bretta BangWilliam  Woodruff III M.D.   On: 12/05/2016 09:23     Visual Acuity Review  Right Eye Distance:   Left Eye Distance:   Bilateral Distance:    Right Eye Near:   Left Eye Near:    Bilateral Near:         MDM   1. Contusion of right lower leg, initial encounter   2. Laceration of right leg excluding thigh, initial encounter    Discharge Medication List as of 12/05/2016  9:43 AM    START taking these medications   Details  mupirocin ointment (BACTROBAN) 2 % Apply 1 application topically 3 (three) times daily., Starting Mon 12/05/2016, Normal      Plan: 1. Test/x-ray results and diagnosis reviewed with patient 2. rx as per orders; risks, benefits, potential side effects reviewed with patient 3. Recommend supportive treatment with Continued wound care but change from neomycin to Bactroban. Elevate intermittently as necessary to control swelling. If wound becomes more inflamed and drains any purulent material or notices a red streaks up his leg he will return to our clinic immediately. 4. F/u prn if symptoms worsen or don't improve     Lutricia FeilRoemer, Roberth P, PA-C 12/05/16 0946    Lutricia Feiloemer, Sanchez P, PA-C 12/05/16 [phone]

## 2018-01-10 ENCOUNTER — Ambulatory Visit
Admission: EM | Admit: 2018-01-10 | Discharge: 2018-01-10 | Disposition: A | Discharge status: Home / Self Care | Payer: BLUE CROSS/BLUE SHIELD | Attending: Family Medicine | Admitting: Family Medicine

## 2018-01-10 DIAGNOSIS — S30860A Insect bite (nonvenomous) of lower back and pelvis, initial encounter: Secondary | ICD-10-CM | POA: Diagnosis not present

## 2018-01-10 DIAGNOSIS — W57XXXA Bitten or stung by nonvenomous insect and other nonvenomous arthropods, initial encounter: Secondary | ICD-10-CM | POA: Diagnosis not present

## 2018-01-10 DIAGNOSIS — I10 Essential (primary) hypertension: Secondary | ICD-10-CM | POA: Diagnosis not present

## 2018-01-10 MED ORDER — AMLODIPINE BESYLATE 5 MG PO TABS: 5.0000 mg | ORAL_TABLET | Freq: Every day | ORAL | 1 refills | Status: DC

## 2018-01-10 MED ORDER — TRIAMCINOLONE ACETONIDE 0.1 % EX OINT: 1.0000 "application " | TOPICAL_OINTMENT | Freq: Two times a day (BID) | CUTANEOUS | 0 refills | Status: DC

## 2018-01-10 MED ORDER — DOXYCYCLINE HYCLATE 100 MG PO CAPS: 100.0000 mg | ORAL_CAPSULE | Freq: Two times a day (BID) | ORAL | 0 refills | Status: DC

## 2018-01-10 NOTE — Discharge Instructions (Signed)
Medications as prescribed.  Primary care options: [facility], [facility], [facility].  Be sure to get yourself a primary care physician.  Take care  Dr. Adriana Simasook

## 2018-01-10 NOTE — ED Triage Notes (Signed)
As per patient had tick bite 3 days ago and now has Bruce on the back.

## 2018-01-10 NOTE — ED Provider Notes (Signed)
[name]    CSN: [id number] Arrival date & time: 01/10/18  0801  History   Chief Complaint Chief Complaint  Patient presents with  . Tick Removal   HPI  57 year old male presents for evaluation after suffering a tick bite.  Patient states that he was outside on Thursday or Friday.  He subsequently was bitten by a tick.  He removed the tick without difficulty.  He reports that on Sunday Monday he felt lethargic and had neck stiffness and myalgias.  Felt poorly.  He states that he is now feeling better.  However, he is developed a rash on his low back around the site of the tick bite.  He states that it has been itchy.  No reports of fever.  No medications or interventions tried.  No other associated symptoms.  No other complaints.  Past Medical History:  Diagnosis Date  . Hypertension    Patient Active Problem List   Diagnosis Date Noted  . Acute cholecystitis 08/03/2015  . Acute hypokalemia    Past Surgical History:  Procedure Laterality Date  . CHOLECYSTECTOMY N/A 08/04/2015   Procedure: LAPAROSCOPIC CHOLECYSTECTOMY;  Surgeon: Lattie Haw, MD;  Location: [facility] ORS;  Service: General;  Laterality: N/A;  . KNEE SURGERY Bilateral   . KNEE SURGERY     bilateral    Home Medications    Prior to Admission medications   Medication Sig Start Date End Date Taking? Authorizing Provider  amLODipine (NORVASC) 5 MG tablet Take 1 tablet (5 mg total) by mouth daily. 01/10/18   Tommie Sams, DO  doxycycline (VIBRAMYCIN) 100 MG capsule Take 1 capsule (100 mg total) by mouth 2 (two) times daily. 01/10/18   Tommie Sams, DO  triamcinolone ointment (KENALOG) 0.1 % Apply 1 application topically 2 (two) times daily. 01/10/18   Tommie Sams, DO    Family History Family History  Problem Relation Age of Onset  . Parkinson's disease Mother   . Cancer Father   . Cancer Sister   . Lupus Sister     Social History Social History   Tobacco Use  . Smoking status: Current  Every Day Smoker    Packs/day: 1.00    Types: Cigarettes  . Smokeless tobacco: Never Used  Substance Use Topics  . Alcohol use: No  . Drug use: No     Allergies   Codeine and Penicillins   Review of Systems Review of Systems  Constitutional: Positive for fatigue. Negative for fever.  Musculoskeletal: Positive for myalgias and neck stiffness.  Skin: Positive for rash.   Physical Exam Triage Vital Signs ED Triage Vitals  Enc Vitals Group     BP 01/10/18 0814 (!) 174/116     Pulse Rate 01/10/18 0814 79     Resp 01/10/18 0814 16     Temp 01/10/18 0814 98.4 F (36.9 C)     Temp Source 01/10/18 0814 Oral     SpO2 01/10/18 0814 100 %     Weight 01/10/18 0813 150 lb (68 kg)     Height 01/10/18 0813 5\' 9"  (1.753 m)     Head Circumference --      Peak Flow --      Pain Score 01/10/18 0813 0     Pain Loc --      Pain Edu? --      Excl. in GC? --    Updated Vital Signs BP (!) 170/107   Pulse 79   Temp 98.4 F (36.9  C) (Oral)   Resp 16   Ht 5\' 9"  (1.753 m)   Wt 150 lb (68 kg)   SpO2 100%   BMI 22.15 kg/m   Physical Exam  Constitutional: He is oriented to person, place, and time. He appears well-developed. No distress.  Cardiovascular: Normal rate and regular rhythm.  Pulmonary/Chest: Effort normal and breath sounds normal. He has no wheezes. He has no rales.  Neurological: He is alert and oriented to person, place, and time.  Skin:  Low back with a large area of erythematous, papular rash.  No evidence of erythema migrans.  Psychiatric: He has a normal mood and affect. His behavior is normal.  Nursing note and vitals reviewed.  UC Treatments / Results  Labs (all labs ordered are listed, but only abnormal results are displayed) Labs Reviewed - No data to display  EKG None  Radiology No results found.  Procedures Procedures (including critical care time)  Medications Ordered in UC Medications - No data to display  Initial Impression / Assessment and  Plan / UC Course  I have reviewed the triage vital signs and the nursing notes.  Pertinent labs & imaging results that were available during my care of the patient were reviewed by me and considered in my medical decision making (see chart for details).    57 year old male presents with a tick bite.  Given symptoms, covering for tickborne illness with doxycycline.  Triamcinolone for his rash which does not appear to be consistent with erythema migrans.  Patient's blood pressure markedly elevated today.  Patient states that he is currently taking metoprolol every other day.  He is only taking 1 pill a day.  Stopping metoprolol.  Starting amlodipine.  Advised to get a primary care physician.  Final Clinical Impressions(s) / UC Diagnoses   Final diagnoses:  Tick bite, initial encounter  Essential hypertension     Discharge Instructions     Medications as prescribed.  [facility] options: [facility], [facility], [facility].  Be sure to get yourself a primary care physician.  Take care  Dr. Adriana Simasook    ED Prescriptions    Medication Sig Dispense Auth. Provider   amLODipine (NORVASC) 5 MG tablet Take 1 tablet (5 mg total) by mouth daily. 90 tablet Khalin Royce G, DO   triamcinolone ointment (KENALOG) 0.1 % Apply 1 application topically 2 (two) times daily. 30 g Shiesha Jahn G, DO   doxycycline (VIBRAMYCIN) 100 MG capsule Take 1 capsule (100 mg total) by mouth 2 (two) times daily. 20 capsule Tommie Samsook, Jaeceon Michelin G, DO     Controlled Substance Prescriptions [place] Controlled Substance Registry consulted? Not Applicable   Tommie SamsCook, Ingris Pasquarella G, [place] 01/10/18 [id number]

## 2018-06-13 ENCOUNTER — Encounter: Payer: Self-pay | Admitting: Emergency Medicine

## 2018-06-13 ENCOUNTER — Other Ambulatory Visit: Payer: Self-pay

## 2018-06-13 ENCOUNTER — Ambulatory Visit
Admission: EM | Admit: 2018-06-13 | Discharge: 2018-06-13 | Disposition: A | Discharge status: Home / Self Care | Payer: MEDICAID | Attending: Family Medicine | Admitting: Family Medicine

## 2018-06-13 DIAGNOSIS — I10 Essential (primary) hypertension: Secondary | ICD-10-CM

## 2018-06-13 DIAGNOSIS — R21 Rash and other nonspecific skin eruption: Secondary | ICD-10-CM | POA: Diagnosis not present

## 2018-06-13 MED ORDER — FLUOCINONIDE 0.05 % EX CREA: 1.0000 "application " | TOPICAL_CREAM | Freq: Two times a day (BID) | CUTANEOUS | 1 refills | Status: DC

## 2018-06-13 NOTE — Discharge Instructions (Signed)
Recommend start Lidex cream- apply twice a day to affected areas as needed. Try to avoid face. Continue to use moisturizing cream. Encouraged to take blood pressure medication daily. Continue to monitor blood pressure. Follow-up in 2 weeks if rash is not improving.

## 2018-06-13 NOTE — ED Triage Notes (Signed)
Patient c/o intermittent rash that he has had for over 1 year. The areas usually respond to an oatmeal lotion or Goldbond lotion. Patient now has an area on his right ring finger and middle finger x 10 days that has not responded to the OTC creams.

## 2018-06-14 NOTE — ED Provider Notes (Signed)
[name]    CSN: [id number] Arrival date & time: 06/13/18  0948     History   Chief Complaint Chief Complaint  Patient presents with  . Rash    HPI Brian Walker is a 57 y.o. male.   57 year old male presents with rash that has intermittent flare-ups over the past year. Occasionally will have dry areas that he believes is eczema in the back of his neck, occasionally on his forehead but mostly in between his fingers and wrists. Does not usually have any rash on elbows, knees or other flexure areas. He has used OTC Goldbond lotion and oatmeal in the past with good success. In the past 7-10 days, in between his right middle and 4th finger, the area has become more red, dry, cracked and irritated. He tried his usual OTC lotions with minimal relief. Area is not painful but "sore" and burns some when exposed to water. He also has a history of HTN- currently on Norvasc 5mg  daily which has been controlling his blood pressure better than Metoprolol. However, he has not taken his medication in the past 5 days. He does have enough pills and a refill. He does not have a current PCP.   The history is provided by the patient.    Past Medical History:  Diagnosis Date  . Hypertension     Patient Active Problem List   Diagnosis Date Noted  . Acute cholecystitis 08/03/2015  . Acute hypokalemia     Past Surgical History:  Procedure Laterality Date  . CHOLECYSTECTOMY N/A 08/04/2015   Procedure: LAPAROSCOPIC CHOLECYSTECTOMY;  Surgeon: Lattie Haw, MD;  Location: [facility] ORS;  Service: General;  Laterality: N/A;  . KNEE SURGERY Bilateral   . KNEE SURGERY     bilateral       Home Medications    Prior to Admission medications   Medication Sig Start Date End Date Taking? Authorizing Provider  amLODipine (NORVASC) 5 MG tablet Take 1 tablet (5 mg total) by mouth daily. Patient taking differently: Take 5 mg by mouth daily.  01/10/18  Yes Cook, Jayce G, DO  fluocinonide  cream (LIDEX) 0.05 % Apply 1 application topically 2 (two) times daily. 06/13/18   Sudie Grumbling, NP    Family History Family History  Problem Relation Age of Onset  . Parkinson's disease Mother   . Cancer Father   . Cancer Sister   . Lupus Sister     Social History Social History   Tobacco Use  . Smoking status: Current Every Day Smoker    Packs/day: 1.00    Types: Cigarettes  . Smokeless tobacco: Never Used  Substance Use Topics  . Alcohol use: No  . Drug use: No     Allergies   Codeine and Penicillins   Review of Systems Review of Systems  Constitutional: Negative for activity change, appetite change, chills, fatigue, fever and unexpected weight change.  HENT: Negative for congestion, facial swelling, mouth sores, nosebleeds, postnasal drip, rhinorrhea, sinus pressure, sinus pain, sneezing, sore throat and trouble swallowing.   Eyes: Negative for photophobia, pain, discharge, redness, itching and visual disturbance.  Respiratory: Negative for cough, chest tightness, shortness of breath and wheezing.   Cardiovascular: Negative for chest pain and palpitations.  Gastrointestinal: Negative for abdominal pain, nausea and vomiting.  Musculoskeletal: Negative for arthralgias, back pain, myalgias, neck pain and neck stiffness.  Skin: Positive for color change and rash.  Neurological: Negative for dizziness, tremors, seizures, syncope, speech difficulty, weakness,  light-headedness, numbness and headaches.  Hematological: Negative for adenopathy. Does not bruise/bleed easily.     Physical Exam Triage Vital Signs ED Triage Vitals  Enc Vitals Group     BP 06/13/18 1018 (!) 153/107     Pulse Rate 06/13/18 1018 63     Resp 06/13/18 1018 18     Temp 06/13/18 1018 98.2 F (36.8 C)     Temp Source 06/13/18 1018 Oral     SpO2 06/13/18 1018 97 %     Weight 06/13/18 1015 150 lb (68 kg)     Height 06/13/18 1015 5\' 9"  (1.753 m)     Head Circumference --      Peak Flow --        Pain Score 06/13/18 1014 4     Pain Loc --      Pain Edu? --      Excl. in GC? --    No data found.  Updated Vital Signs BP (!) 153/107 (BP Location: Left Arm) Comment: patient has not taken BP medications in 3 days   Pulse 63   Temp 98.2 F (36.8 C) (Oral)   Resp 18   Ht 5\' 9"  (1.753 m)   Wt 150 lb (68 kg)   SpO2 97%   BMI 22.15 kg/m   Visual Acuity Right Eye Distance:   Left Eye Distance:   Bilateral Distance:    Right Eye Near:   Left Eye Near:    Bilateral Near:     Physical Exam  Constitutional: He is oriented to person, place, and time. He appears well-developed and well-nourished. He is cooperative. He does not appear ill. No distress.  Patient sitting comfortably in exam chair in no acute distress.   HENT:  Head: Normocephalic and atraumatic.  Right Ear: Hearing and external ear normal.  Left Ear: Hearing and external ear normal.  Mouth/Throat: Oropharynx is clear and moist. Abnormal dentition.  Eyes: Conjunctivae and EOM are normal.  Neck: Normal range of motion.  Cardiovascular: Normal rate, regular rhythm and normal heart sounds.  No murmur heard. Pulmonary/Chest: Effort normal. No respiratory distress. He has decreased breath sounds in the right upper field, the right middle field, the right lower field, the left upper field and the left lower field. He has no wheezes. He has no rhonchi. He has no rales.  Musculoskeletal: Normal range of motion.       Right hand: He exhibits normal range of motion, no tenderness, normal capillary refill and no swelling. Normal sensation noted. Normal strength noted.       Hands: Red, irritated skin present from base of 3rd phalange to distal joint on lateral aspect of finger. Some excoriation and crusting present. No distinct discharge. Redness and irritation are also present on 4th phalange from base of finger to mid-joint along area in contact with 3rd finger. No distinct ulcers seen. Non-tender. No signs of secondary  bacterial infection present. Has full range of motion of hand and fingers. Good capillary refill. No neuro deficits noted.   Neurological: He is alert and oriented to person, place, and time. He has normal strength.  Skin: Skin is warm and dry. Capillary refill takes less than 2 seconds. Rash noted. No purpura noted. Rash is maculopapular. Rash is not nodular, not pustular, not vesicular and not urticarial. There is erythema.  Psychiatric: He has a normal mood and affect. His behavior is normal. Judgment and thought content normal.  Vitals reviewed.    UC Treatments / Results  Labs (all labs ordered are listed, but only abnormal results are displayed) Labs Reviewed - No data to display  EKG None  Radiology No results found.  Procedures Procedures (including critical care time)  Medications Ordered in UC Medications - No data to display  Initial Impression / Assessment and Plan / UC Course  I have reviewed the triage vital signs and the nursing notes.  Pertinent labs & imaging results that were available during my care of the patient were reviewed by me and considered in my medical decision making (see chart for details).    Discussed with patient that he probably has a severe flare-up of dermatitis (eczema). Will trial high potency steroid cream for now. May need dermatology referral. Recommend trial Lidex cream- apply twice a day to affected areas as needed. May continue to use moisturizing cream/lotions. Encouraged to continue to monitor blood pressure and take Norvasc daily. Recommend follow-up within 2 weeks here if rash is not improving for possible referral to Dermatologist.  Final Clinical Impressions(s) / UC Diagnoses   Final diagnoses:  Rash and nonspecific skin eruption  Elevated blood pressure reading with diagnosis of hypertension     Discharge Instructions     Recommend start Lidex cream- apply twice a day to affected areas as needed. Try to avoid face. Continue  to use moisturizing cream. Encouraged to take blood pressure medication daily. Continue to monitor blood pressure. Follow-up in 2 weeks if rash is not improving.     ED Prescriptions    Medication Sig Dispense Auth. Provider   fluocinonide cream (LIDEX) 0.05 % Apply 1 application topically 2 (two) times daily. 30 g Sudie Grumbling, NP     Controlled Substance Prescriptions [place] Controlled Substance Registry consulted? Not Applicable   Sudie Grumbling, NP 06/14/18 1011

## 2018-07-06 ENCOUNTER — Other Ambulatory Visit: Payer: Self-pay

## 2018-07-06 ENCOUNTER — Ambulatory Visit
Admission: EM | Admit: 2018-07-06 | Discharge: 2018-07-06 | Disposition: A | Discharge status: Home / Self Care | Payer: MEDICAID | Attending: Family Medicine | Admitting: Family Medicine

## 2018-07-06 ENCOUNTER — Encounter: Payer: Self-pay | Admitting: Emergency Medicine

## 2018-07-06 DIAGNOSIS — J111 Influenza due to unidentified influenza virus with other respiratory manifestations: Secondary | ICD-10-CM

## 2018-07-06 DIAGNOSIS — I10 Essential (primary) hypertension: Secondary | ICD-10-CM

## 2018-07-06 DIAGNOSIS — R69 Illness, unspecified: Secondary | ICD-10-CM

## 2018-07-06 DIAGNOSIS — R51 Headache: Secondary | ICD-10-CM

## 2018-07-06 DIAGNOSIS — R5383 Other fatigue: Secondary | ICD-10-CM | POA: Diagnosis not present

## 2018-07-06 DIAGNOSIS — R05 Cough: Secondary | ICD-10-CM | POA: Diagnosis not present

## 2018-07-06 DIAGNOSIS — R0981 Nasal congestion: Secondary | ICD-10-CM

## 2018-07-06 LAB — RAPID STREP SCREEN (MED CTR MEBANE ONLY): Streptococcus, Group A Screen (Direct): NEGATIVE

## 2018-07-06 MED ORDER — OSELTAMIVIR PHOSPHATE 75 MG PO CAPS: 75.0000 mg | ORAL_CAPSULE | Freq: Two times a day (BID) | ORAL | 0 refills | Status: DC

## 2018-07-06 NOTE — ED Provider Notes (Signed)
[name]    CSN: [id number] Arrival date & time: 07/06/18  1157     History   Chief Complaint Chief Complaint  Patient presents with  . Sore Throat  . Fever    HPI Brian Walker is a 57 y.o. male.   The history is provided by the patient.  Sore Throat  Associated symptoms include headaches.  Fever  Associated symptoms: congestion, cough, headaches, myalgias, rhinorrhea and sore throat   URI  Presenting symptoms: congestion, cough, fatigue, fever, rhinorrhea and sore throat   Severity:  Moderate Onset quality:  Sudden Duration:  2 days Timing:  Constant Progression:  Worsening Chronicity:  New Relieved by:  Nothing Ineffective treatments:  OTC medications Associated symptoms: headaches and myalgias   Risk factors: sick contacts     Past Medical History:  Diagnosis Date  . Hypertension     Patient Active Problem List   Diagnosis Date Noted  . Acute cholecystitis 08/03/2015  . Acute hypokalemia     Past Surgical History:  Procedure Laterality Date  . CHOLECYSTECTOMY N/A 08/04/2015   Procedure: LAPAROSCOPIC CHOLECYSTECTOMY;  Surgeon: Lattie Hawichard E Cooper, MD;  Location: [facility] ORS;  Service: General;  Laterality: N/A;  . KNEE SURGERY Bilateral   . KNEE SURGERY     bilateral       Home Medications    Prior to Admission medications   Medication Sig Start Date End Date Taking? Authorizing Provider  amLODipine (NORVASC) 5 MG tablet Take 1 tablet (5 mg total) by mouth daily. Patient taking differently: Take 5 mg by mouth daily.  01/10/18  Yes Cook, Jayce G, DO  fluocinonide cream (LIDEX) 0.05 % Apply 1 application topically 2 (two) times daily. 06/13/18   Sudie GrumblingAmyot, Ann Berry, NP  oseltamivir (TAMIFLU) 75 MG capsule Take 1 capsule (75 mg total) by mouth 2 (two) times daily. 07/06/18   Payton Mccallumonty, Yaritzi Craun, MD    Family History Family History  Problem Relation Age of Onset  . Parkinson's disease Mother   . Cancer Father   . Cancer Sister   . Lupus  Sister     Social History Social History   Tobacco Use  . Smoking status: Current Every Day Smoker    Packs/day: 1.00    Types: Cigarettes  . Smokeless tobacco: Never Used  Substance Use Topics  . Alcohol use: No  . Drug use: No     Allergies   Codeine and Penicillins   Review of Systems Review of Systems  Constitutional: Positive for fatigue and fever.  HENT: Positive for congestion, rhinorrhea and sore throat.   Respiratory: Positive for cough.   Musculoskeletal: Positive for myalgias.  Neurological: Positive for headaches.     Physical Exam Triage Vital Signs ED Triage Vitals  Enc Vitals Group     BP 07/06/18 1218 (!) 160/90     Pulse Rate 07/06/18 1218 82     Resp 07/06/18 1218 16     Temp 07/06/18 1218 98.9 F (37.2 C)     Temp Source 07/06/18 1218 Oral     SpO2 07/06/18 1218 99 %     Weight 07/06/18 1214 150 lb (68 kg)     Height 07/06/18 1214 5\' 9"  (1.753 m)     Head Circumference --      Peak Flow --      Pain Score 07/06/18 1214 3     Pain Loc --      Pain Edu? --      Excl. in  GC? --    No data found.  Updated Vital Signs BP (!) 160/90 (BP Location: Right Arm)   Pulse 82   Temp 98.9 F (37.2 C) (Oral)   Resp 16   Ht 5\' 9"  (1.753 m)   Wt 68 kg   SpO2 99%   BMI 22.15 kg/m   Visual Acuity Right Eye Distance:   Left Eye Distance:   Bilateral Distance:    Right Eye Near:   Left Eye Near:    Bilateral Near:     Physical Exam Vitals signs and nursing note reviewed.  Constitutional:      General: He is not in acute distress.    Appearance: He is well-developed. He is not diaphoretic.  HENT:     Head: Normocephalic and atraumatic.     Right Ear: Tympanic membrane, ear canal and external ear normal.     Left Ear: Tympanic membrane, ear canal and external ear normal.     Nose: Nose normal.     Mouth/Throat:     Pharynx: Uvula midline. No oropharyngeal exudate.     Tonsils: No tonsillar abscesses.  Eyes:     General: No scleral  icterus.       Right eye: No discharge.        Left eye: No discharge.     Conjunctiva/sclera: Conjunctivae normal.     Pupils: Pupils are equal, round, and reactive to light.  Neck:     Musculoskeletal: Normal range of motion and neck supple.     Thyroid: No thyromegaly.     Trachea: No tracheal deviation.  Cardiovascular:     Rate and Rhythm: Normal rate and regular rhythm.     Heart sounds: Normal heart sounds.  Pulmonary:     Effort: Pulmonary effort is normal. No respiratory distress.     Breath sounds: Normal breath sounds. No stridor. No wheezing, rhonchi or rales.  Chest:     Chest wall: No tenderness.  Lymphadenopathy:     Cervical: No cervical adenopathy.  Skin:    General: Skin is warm and dry.     Findings: No rash.  Neurological:     Mental Status: He is alert.      UC Treatments / Results  Labs (all labs ordered are listed, but only abnormal results are displayed) Labs Reviewed  RAPID STREP SCREEN ([name] ONLY)  CULTURE, GROUP A STREP [facility])    EKG None  Radiology No results found.  Procedures Procedures (including critical care time)  Medications Ordered in UC Medications - No data to display  Initial Impression / Assessment and Plan / UC Course  I have reviewed the triage vital signs and the nursing notes.  Pertinent labs & imaging results that were available during my care of the patient were reviewed by me and considered in my medical decision making (see chart for details).      Final Clinical Impressions(s) / UC Diagnoses   Final diagnoses:  Influenza-like illness    ED Prescriptions    Medication Sig Dispense Auth. Provider   oseltamivir (TAMIFLU) 75 MG capsule Take 1 capsule (75 mg total) by mouth 2 (two) times daily. 10 capsule Payton Mccallum, MD     1. Lab result and diagnosis reviewed with patient 2. rx as per orders above; reviewed possible side effects, interactions, risks and benefits  3. Recommend supportive  treatment with rest, fluids, otc analgesics prn 4. Follow-up prn if symptoms worsen or don't improve   Controlled Substance  Prescriptions [place] Controlled Substance Registry consulted? Not Applicable   Payton Mccallum, MD 07/06/18 1321

## 2018-07-06 NOTE — ED Triage Notes (Signed)
Patient c/o sore throat and fever that started 2 days.

## 2018-07-09 LAB — CULTURE, GROUP A STREP (THRC)

## 2018-09-09 IMAGING — CR DG ANKLE COMPLETE 3+V*R*
3 series · 3 of 3 positions shown · non-contrast
Comparison: None.

CLINICAL DATA: Dropped heavy object on distal lower extremity

EXAM:
RIGHT ANKLE - COMPLETE 3+ VIEW

[ankle ap]
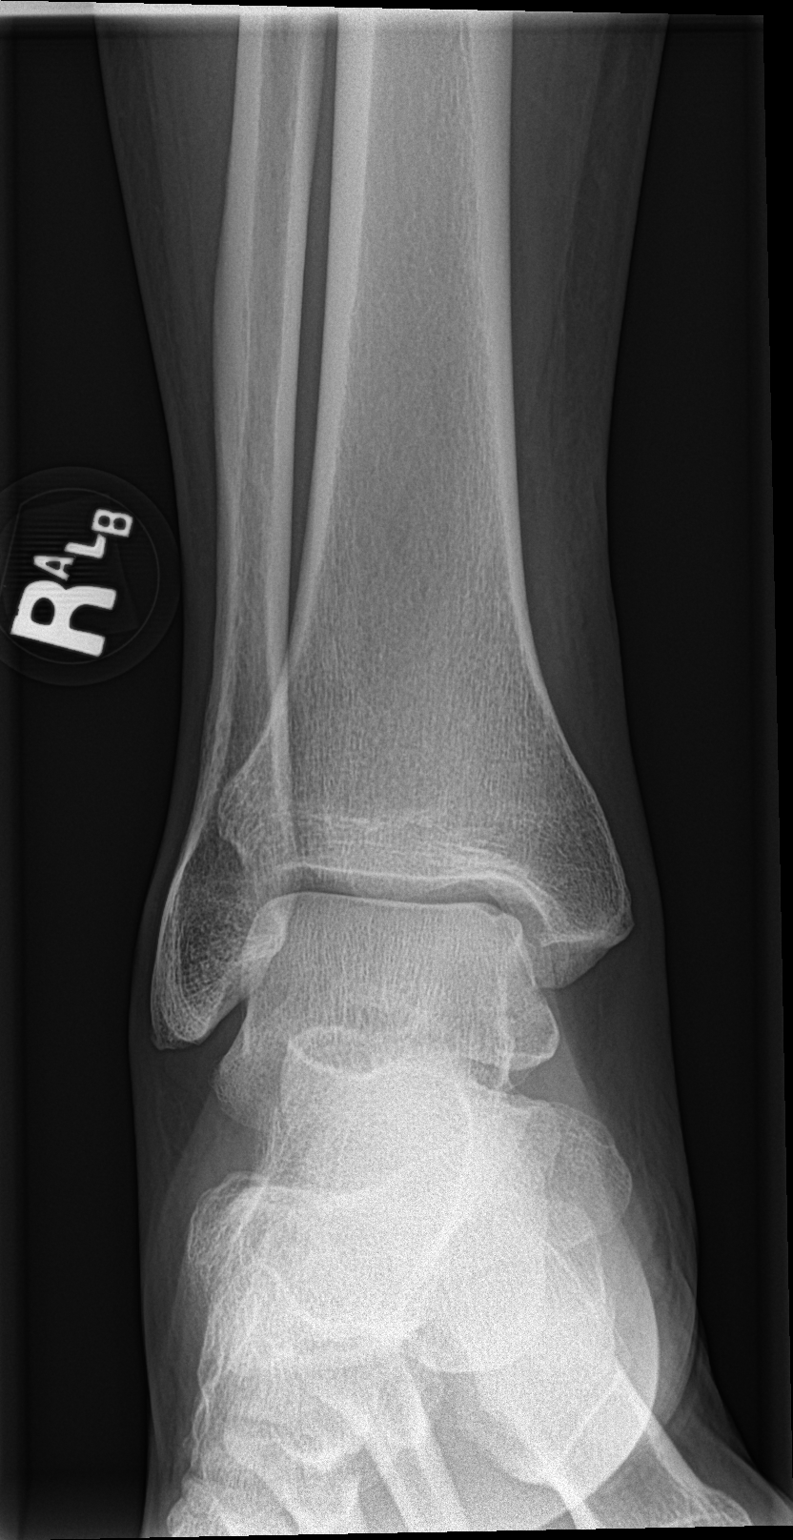

[ankle obl]
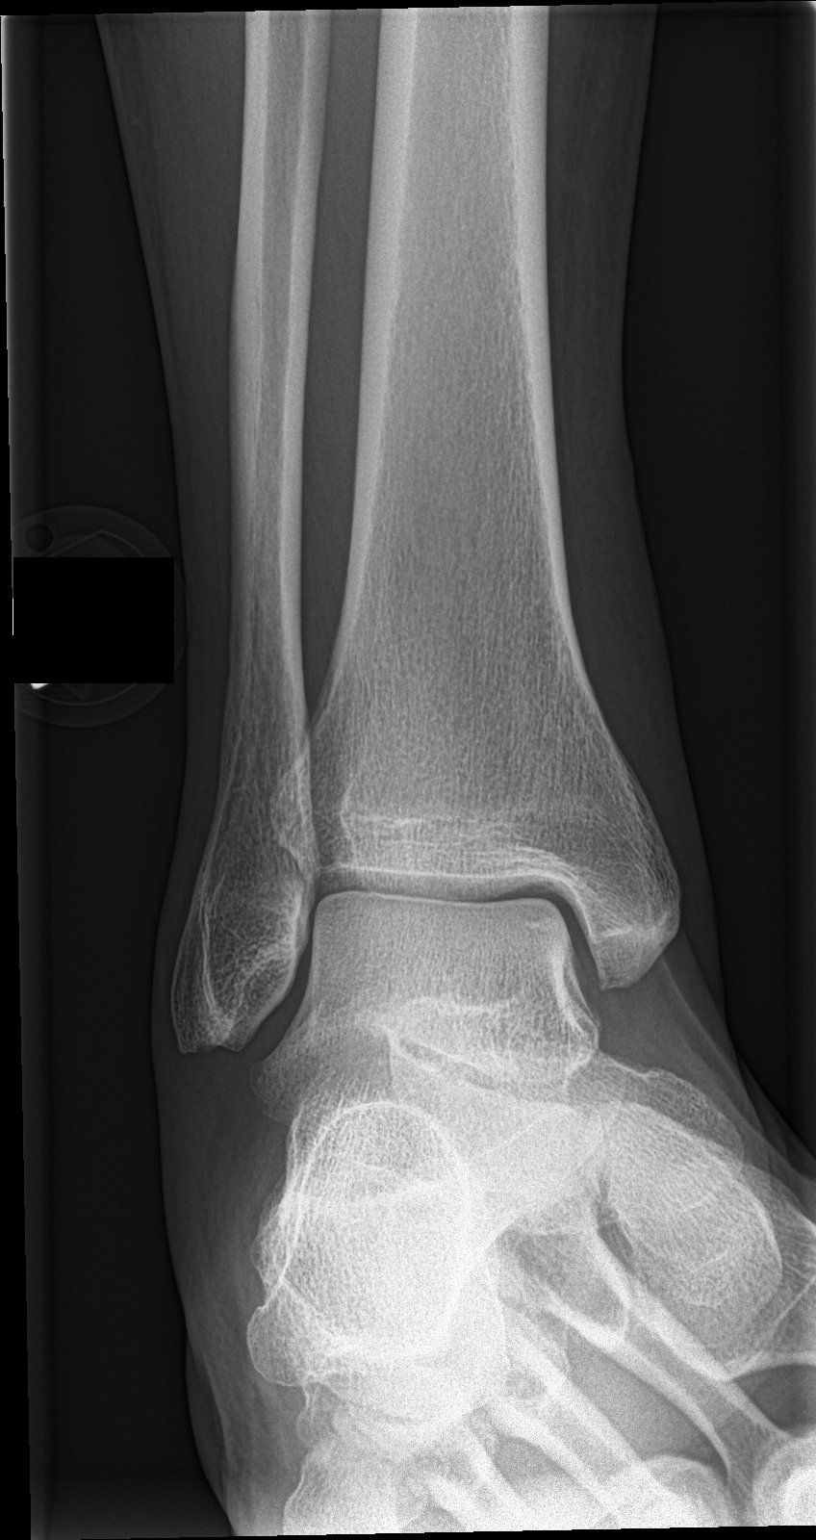

[ankle lat]
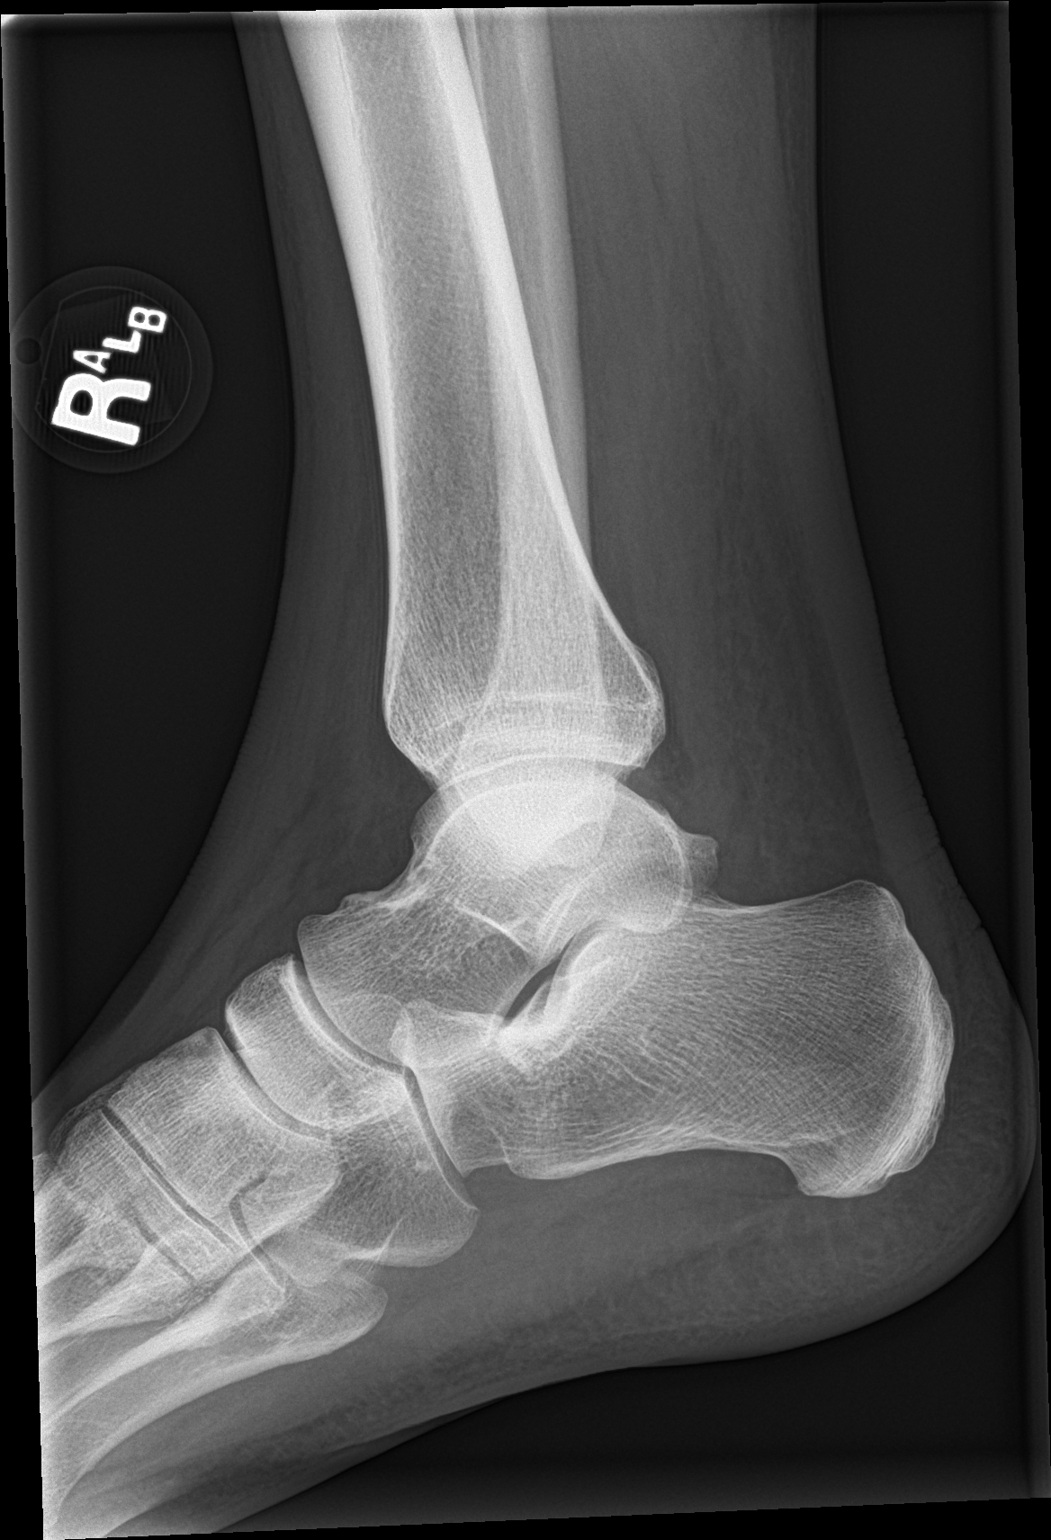

[3 of 3 positions shown; findings below may reference images not displayed]

FINDINGS: Frontal, oblique, and lateral views obtained. There is mild soft
tissue swelling. No fracture or joint effusion. Ankle mortise
appears intact. No appreciable joint space narrowing.
IMPRESSION: Mild soft tissue swelling. No fracture or arthropathy. Ankle mortise
appears intact.

## 2019-05-21 ENCOUNTER — Other Ambulatory Visit: Payer: Self-pay

## 2019-05-21 ENCOUNTER — Encounter: Payer: Self-pay | Admitting: Emergency Medicine

## 2019-05-21 ENCOUNTER — Ambulatory Visit
Admission: EM | Admit: 2019-05-21 | Discharge: 2019-05-21 | Disposition: A | Discharge status: Home / Self Care | Payer: BC Managed Care – PPO | Attending: Urgent Care | Admitting: Urgent Care

## 2019-05-21 DIAGNOSIS — F17210 Nicotine dependence, cigarettes, uncomplicated: Secondary | ICD-10-CM | POA: Diagnosis not present

## 2019-05-21 DIAGNOSIS — Z7189 Other specified counseling: Secondary | ICD-10-CM | POA: Diagnosis not present

## 2019-05-21 DIAGNOSIS — Z20828 Contact with and (suspected) exposure to other viral communicable diseases: Secondary | ICD-10-CM

## 2019-05-21 DIAGNOSIS — Z20822 Contact with and (suspected) exposure to covid-19: Secondary | ICD-10-CM

## 2019-05-21 HISTORY — DX: Dermatitis, unspecified: L30.9

## 2019-05-21 NOTE — Discharge Instructions (Addendum)
It was very nice seeing you today in clinic. Thank you for entrusting me with your care.  ° °You have been tested for SARS-CoV-2 (novel coronavirus) today. Testing results have been taking between 2 and 5 days. Please self quarantine, per [place] DHHS guidelines, until you have received negative test results.  ° °If you develop any symptoms or concerns, make arrangements to follow up with your regular doctor. If your symptoms are severe, please seek follow up care in the ER. Please remember, our [place] providers are "right here with you" when you need us.  ° °Again, it was my pleasure to take care of you today. Thank you for choosing our clinic. I hope that you start to feel better quickly.  ° °Ruhani Umland, MSN, APRN, FNP-C, CEN °Advanced Practice Provider °[name] °

## 2019-05-21 NOTE — ED Provider Notes (Signed)
[name], [name]   Name: Brian Walker DOB: 02/06/1961 MRN: [id number] CSN: [id number] PCP: Patient, No Pcp Per  Arrival date and time:  05/21/19 [id number]  Chief Complaint:  covid exposure   NOTE: Prior to seeing the patient today, I have reviewed the triage nursing documentation and vital signs. Clinical staff has updated patient's PMH/PSHx, current medication list, and drug allergies/intolerances to ensure comprehensive history available to assist in medical decision making.   History:   HPI: Brian Walker is a 58 y.o. male who presents today with complaints of recent direct exposure to SARS-CoV-2 (novel coronavirus). Known exposure reported to have occurred last week while patient was at a heavily attended event. Patient advises that the person he was exposed to tested positive just a few days ago. He states, "I was sitting close to him at the event. I have a small grandchild and I am concerned". Patient presents today with no symptoms; no cough, fevers, or other symptoms commonly associated with SARS-CoV-2. He advises that he feels generally well. Patient presents for testing out of concern for his personal health. He adds that he is is being required to provide documentation of negative test results before he will be allowed to return to work.   Blood pressure elevated in clinic at 154/102. He has not taken his anti-hypertensive medication today.  Past Medical History:  Diagnosis Date  . Eczema   . Hypertension     Past Surgical History:  Procedure Laterality Date  . CHOLECYSTECTOMY N/A 08/04/2015   Procedure: LAPAROSCOPIC CHOLECYSTECTOMY;  Surgeon: Lattie Hawichard E Cooper, MD;  Location: [facility] ORS;  Service: General;  Laterality: N/A;  . KNEE SURGERY Bilateral   . KNEE SURGERY     bilateral    Family History  Problem Relation Age of Onset  . Parkinson's disease Mother   . Cancer Father   . Cancer Sister   . Lupus Sister     Social History   Tobacco Use  . Smoking status:  Current Every Day Smoker    Packs/day: 1.00    Types: Cigarettes  . Smokeless tobacco: Never Used  Substance Use Topics  . Alcohol use: No  . Drug use: No    Patient Active Problem List   Diagnosis Date Noted  . Acute cholecystitis 08/03/2015  . Acute hypokalemia     Home Medications:    Current Meds  Medication Sig  . amLODipine (NORVASC) 5 MG tablet Take 1 tablet (5 mg total) by mouth daily. (Patient taking differently: Take 5 mg by mouth daily. )  . fluocinonide cream (LIDEX) 0.05 % Apply 1 application topically 2 (two) times daily.    Allergies:   Codeine and Penicillins  Review of Systems (ROS): Review of Systems  Constitutional: Negative for fatigue and fever.  HENT: Negative for congestion, ear pain, postnasal drip, rhinorrhea, sinus pressure, sinus pain, sneezing and sore throat.   Eyes: Negative for pain, discharge and redness.  Respiratory: Negative for cough, chest tightness and shortness of breath.   Cardiovascular: Negative for chest pain and palpitations.  Gastrointestinal: Negative for abdominal pain, diarrhea, nausea and vomiting.  Musculoskeletal: Negative for arthralgias, back pain, myalgias and neck pain.  Skin: Negative for color change, pallor and rash.  Neurological: Negative for dizziness, syncope, weakness and headaches.  Hematological: Negative for adenopathy.     Vital Signs: Today's Vitals   05/21/19 0948 05/21/19 0949 05/21/19 1009  BP: (!) 154/102    Pulse: 82    Resp: 16  Temp: 98.2 F (36.8 C)    TempSrc: Oral    SpO2: 99%    Weight:  150 lb (68 kg)   Height:  5\' 9"  (1.753 m)   PainSc: 0-No pain  0-No pain    Physical Exam: Physical Exam  Constitutional: He is oriented to person, place, and time and well-developed, well-nourished, and in no distress. No distress.  HENT:  Head: Normocephalic and atraumatic.  Right Ear: Tympanic membrane normal.  Left Ear: Tympanic membrane normal.  Nose: Nose normal.  Mouth/Throat: Uvula  is midline, oropharynx is clear and moist and mucous membranes are normal.  Eyes: Pupils are equal, round, and reactive to light. Conjunctivae and EOM are normal.  Neck: Normal range of motion. Neck supple.  Cardiovascular: Normal rate, regular rhythm, normal heart sounds and intact distal pulses. Exam reveals no gallop and no friction rub.  No murmur heard. Pulmonary/Chest: Effort normal. No respiratory distress. He has no decreased breath sounds. He has no wheezes. He has no rhonchi. He has no rales.  Abdominal: Soft. Normal appearance and bowel sounds are normal. He exhibits no distension. There is no abdominal tenderness.  Musculoskeletal: Normal range of motion.  Neurological: He is alert and oriented to person, place, and time. Gait normal.  Skin: Skin is warm and dry. No rash noted. He is not diaphoretic.  Psychiatric: Mood, memory, affect and judgment normal.  Nursing note and vitals reviewed.   Urgent Care Treatments / Results:   LABS: PLEASE NOTE: all labs that were ordered this encounter are listed, however only abnormal results are displayed. Labs Reviewed  NOVEL CORONAVIRUS, NAA (HOSP ORDER, SEND-OUT TO REF LAB; TAT 18-24 HRS)    EKG: -None  RADIOLOGY: No results found.  PROCEDURES: Procedures  MEDICATIONS RECEIVED THIS VISIT: Medications - No data to display  PERTINENT CLINICAL COURSE NOTES/UPDATES:   Initial Impression / Assessment and Plan / Urgent Care Course:  Pertinent labs & imaging results that were available during my care of the patient were personally reviewed by me and considered in my medical decision making (see lab/imaging section of note for values and interpretations).  Brian Walker is a 58 y.o. male who presents to [facility] today with complaints of covid exposure   Patient overall well appearing and in no acute distress today in clinic. Presenting symptoms (see HPI) and exam as documented above. He presents following an exposure  to SARS-CoV-2 (novel coronavirus). Discussed typical symptom constellation. Reviewed potential for infection with recent close contact. Given exposure and potential for infection, testing is reasonable. SARS-CoV-2 swab collected by certified clinical staff. Discussed variable turn around times associated with testing, as swabs are being processed at [facility], and have been between 2-5 days to come back. He was advised to self quarantine, per [facility] DHHS guidelines, until negative results received.   Discussed blood pressure. Patient encouraged to take prescribed medication as soon as he gets home in order to control his blood pressure. If not improving, patient needs to follow up with the prescribing provider to discuss needs for further intervention.   Discussed follow up with primary care physician should he develop any concerning symptoms. I have reviewed the follow up and strict return precautions for any new or worsening symptoms. Patient is aware of symptoms that would be deemed urgent/emergent, and would thus require further evaluation either here or in the emergency department. At the time of discharge, he verbalized understanding and consent with the discharge plan as it was reviewed with him. All  questions were fielded by provider and/or clinic staff prior to patient discharge.    Final Clinical Impressions / Urgent Care Diagnoses:   Final diagnoses:  Encounter for laboratory testing for COVID-19 virus  Advice given about COVID-19 virus infection    New Prescriptions:  [place] Controlled Substance Registry consulted? Not Applicable  No orders of the defined types were placed in this encounter.   Recommended Follow up Care:  Patient encouraged to follow up with the following provider within the specified time frame, or sooner as dictated by the severity of his symptoms. As always, he was instructed that for any urgent/emergent care needs, he should seek care either here or in the emergency  department for more immediate evaluation.  Follow-up Information    PCP.   Why: As needed        NOTE: This note was prepared using Lobbyist along with smaller Company secretary. Despite my best ability to proofread, there is the potential that transcriptional errors may still occur from this process, and are completely unintentional.    Karen Kitchens, NP 05/21/19 1133

## 2019-05-21 NOTE — ED Triage Notes (Signed)
Patient in today requesting a covid test. Patient states he was at an event over the weekend and a person there tested positive. Patient has no symptoms.

## 2019-05-22 LAB — NOVEL CORONAVIRUS, NAA (HOSP ORDER, SEND-OUT TO REF LAB; TAT 18-24 HRS): SARS-CoV-2, NAA: NOT DETECTED

## 2019-06-24 ENCOUNTER — Ambulatory Visit
Admission: EM | Admit: 2019-06-24 | Discharge: 2019-06-24 | Disposition: A | Discharge status: Home / Self Care | Payer: BC Managed Care – PPO | Attending: Family Medicine | Admitting: Family Medicine

## 2019-06-24 ENCOUNTER — Other Ambulatory Visit: Payer: Self-pay

## 2019-06-24 DIAGNOSIS — Z20822 Contact with and (suspected) exposure to covid-19: Secondary | ICD-10-CM

## 2019-06-24 DIAGNOSIS — F17210 Nicotine dependence, cigarettes, uncomplicated: Secondary | ICD-10-CM

## 2019-06-24 DIAGNOSIS — Z20828 Contact with and (suspected) exposure to other viral communicable diseases: Secondary | ICD-10-CM

## 2019-06-24 NOTE — ED Provider Notes (Signed)
[name]    CSN: [id number] Arrival date & time: 06/24/19  4098  History   Chief Complaint Chief Complaint  Patient presents with  . Labs Only   HPI  58 year old male presents for Covid testing.  Patient states that last Sunday he did not feel well.  He states that he had chills, fever, body aches, and also had a decrease in taste.  He states that his symptoms lasted for 1-1/2 to 2 days and then resolved.  He states that he was back to his normal self by Wednesday.  Patient reports that his daughter was concerned that he may have contracted COVID-19 and recommended that he come in for testing.  Patient states that he is currently feeling well.  He is asymptomatic.  He desires Covid testing today.  No other complaints.  PMH, Surgical Hx, Family Hx, Social History reviewed and updated as below.  Past Medical History:  Diagnosis Date  . Eczema   . Hypertension    Patient Active Problem List   Diagnosis Date Noted  . Acute cholecystitis 08/03/2015  . Acute hypokalemia    Past Surgical History:  Procedure Laterality Date  . CHOLECYSTECTOMY N/A 08/04/2015   Procedure: LAPAROSCOPIC CHOLECYSTECTOMY;  Surgeon: Lattie Haw, MD;  Location: [facility] ORS;  Service: General;  Laterality: N/A;  . KNEE SURGERY Bilateral   . KNEE SURGERY     bilateral   Home Medications    Prior to Admission medications   Medication Sig Start Date End Date Taking? Authorizing Provider  amLODipine (NORVASC) 5 MG tablet Take 1 tablet (5 mg total) by mouth daily. Patient taking differently: Take 5 mg by mouth daily.  01/10/18   Tommie Sams, DO  fluocinonide cream (LIDEX) 0.05 % Apply 1 application topically 2 (two) times daily. 06/13/18   Sudie Grumbling, NP   Family History Family History  Problem Relation Age of Onset  . Parkinson's disease Mother   . Cancer Father   . Cancer Sister   . Lupus Sister    Social History Social History   Tobacco Use  . Smoking status: Current  Every Day Smoker    Packs/day: 1.00    Types: Cigarettes  . Smokeless tobacco: Never Used  Substance Use Topics  . Alcohol use: No  . Drug use: No    Allergies   Codeine and Penicillins   Review of Systems Review of Systems  Constitutional: Negative.   HENT: Negative.    Physical Exam Triage Vital Signs ED Triage Vitals  Enc Vitals Group     BP 06/24/19 0939 (!) 167/106     Pulse Rate 06/24/19 0939 73     Resp 06/24/19 0939 18     Temp 06/24/19 0939 98.8 F (37.1 C)     Temp Source 06/24/19 0939 Oral     SpO2 06/24/19 0939 97 %     Weight 06/24/19 0941 149 lb 14.6 oz (68 kg)     Height 06/24/19 0941 5\' 9"  (1.753 m)     Head Circumference --      Peak Flow --      Pain Score 06/24/19 0940 0     Pain Loc --      Pain Edu? --      Excl. in GC? --    Updated Vital Signs BP (!) 167/106 (BP Location: Right Arm)   Pulse 73   Temp 98.8 F (37.1 C) (Oral)   Resp 18   Ht 5\' 9"  (1.753  m)   Wt 68 kg   SpO2 97%   BMI 22.14 kg/m   Visual Acuity Right Eye Distance:   Left Eye Distance:   Bilateral Distance:    Right Eye Near:   Left Eye Near:    Bilateral Near:     Physical Exam Vitals signs and nursing note reviewed.  Constitutional:      General: He is not in acute distress.    Appearance: Normal appearance. He is not ill-appearing.  HENT:     Head: Normocephalic and atraumatic.  Eyes:     General:        Right eye: No discharge.        Left eye: No discharge.     Conjunctiva/sclera: Conjunctivae normal.  Cardiovascular:     Rate and Rhythm: Normal rate and regular rhythm.     Heart sounds: No murmur.  Pulmonary:     Effort: Pulmonary effort is normal.     Breath sounds: Normal breath sounds. No wheezing, rhonchi or rales.  Neurological:     Mental Status: He is alert.  Psychiatric:        Mood and Affect: Mood normal.        Behavior: Behavior normal.    UC Treatments / Results  Labs (all labs ordered are listed, but only abnormal results are  displayed) Labs Reviewed  NOVEL CORONAVIRUS, NAA (HOSP ORDER, SEND-OUT TO REF LAB; TAT 18-24 HRS)    EKG   Radiology No results found.  Procedures Procedures (including critical care time)  Medications Ordered in UC Medications - No data to display  Initial Impression / Assessment and Plan / UC Course  I have reviewed the triage vital signs and the nursing notes.  Pertinent labs & imaging results that were available during my care of the patient were reviewed by me and considered in my medical decision making (see chart for details).    58 year old male presents for Covid testing.  Awaiting results.  Final Clinical Impressions(s) / UC Diagnoses   Final diagnoses:  Encounter for laboratory testing for COVID-19 virus     Discharge Instructions     Results available in 24-48 hours.  Take care  Dr. Lacinda Axon    ED Prescriptions    None     PDMP not reviewed this encounter.   Coral Spikes, DO 06/24/19 1016

## 2019-06-24 NOTE — ED Triage Notes (Signed)
Pt states he was sick last week for 2 days with loss of taste and fever. Symptoms have gone away and went back to work on Wednesday. He wants a COVID test per his family request

## 2019-06-24 NOTE — Discharge Instructions (Signed)
Results available in 24-48 hours. ° °Take care ° °Dr. Kasara Schomer  °

## 2019-06-26 LAB — NOVEL CORONAVIRUS, NAA (HOSP ORDER, SEND-OUT TO REF LAB; TAT 18-24 HRS): SARS-CoV-2, NAA: NOT DETECTED

## 2019-07-24 ENCOUNTER — Encounter: Payer: Self-pay | Admitting: Nurse Practitioner

## 2019-07-24 ENCOUNTER — Other Ambulatory Visit: Payer: Self-pay

## 2019-07-24 ENCOUNTER — Ambulatory Visit (INDEPENDENT_AMBULATORY_CARE_PROVIDER_SITE_OTHER): Payer: BC Managed Care – PPO | Admitting: Nurse Practitioner

## 2019-07-24 VITALS — BP 142/90 | HR 67 | Temp 98.7°F | Ht 68.5 in | Wt 153.0 lb

## 2019-07-24 DIAGNOSIS — R3589 Other polyuria: Secondary | ICD-10-CM | POA: Insufficient documentation

## 2019-07-24 DIAGNOSIS — I10 Essential (primary) hypertension: Secondary | ICD-10-CM | POA: Insufficient documentation

## 2019-07-24 DIAGNOSIS — R358 Other polyuria: Secondary | ICD-10-CM | POA: Diagnosis not present

## 2019-07-24 DIAGNOSIS — I1A0 Resistant hypertension: Secondary | ICD-10-CM | POA: Insufficient documentation

## 2019-07-24 DIAGNOSIS — R5383 Other fatigue: Secondary | ICD-10-CM | POA: Diagnosis not present

## 2019-07-24 DIAGNOSIS — Z7689 Persons encountering health services in other specified circumstances: Secondary | ICD-10-CM

## 2019-07-24 DIAGNOSIS — Z13220 Encounter for screening for lipoid disorders: Secondary | ICD-10-CM | POA: Insufficient documentation

## 2019-07-24 DIAGNOSIS — Z0000 Encounter for general adult medical examination without abnormal findings: Secondary | ICD-10-CM | POA: Insufficient documentation

## 2019-07-24 DIAGNOSIS — F17210 Nicotine dependence, cigarettes, uncomplicated: Secondary | ICD-10-CM | POA: Insufficient documentation

## 2019-07-24 MED ORDER — AMLODIPINE BESYLATE 10 MG PO TABS: 10.0000 mg | ORAL_TABLET | Freq: Every day | ORAL | 3 refills | Status: DC

## 2019-07-24 NOTE — Assessment & Plan Note (Signed)
At next visit, annual physical 4 weeks, will require: - colonoscopy ordered - AAA screening - Low dose lung CT order - Spirometry, long time smoker, suspect underlying COPD

## 2019-07-24 NOTE — Patient Instructions (Signed)

## 2019-07-24 NOTE — Assessment & Plan Note (Signed)
Along with ongoing fatigue, will check A1C.  No UTI symptoms present.

## 2019-07-24 NOTE — Assessment & Plan Note (Signed)
I have recommended complete cessation of tobacco use. I have discussed various options available for assistance with tobacco cessation including over the counter methods (Nicotine gum, patch and lozenges). We also discussed prescription options (Chantix, Nicotine Inhaler / Nasal Spray). The patient is not interested in pursuing any prescription tobacco cessation options at this time.  - At physical plan to order CT scan low dose lungs and AAA screening

## 2019-07-24 NOTE — Progress Notes (Signed)
New Patient Office Visit  Subjective:  Patient ID: Brian Walker, male    DOB: September 05, 1960  Age: 58 y.o. MRN: [id number]  CC:  Chief Complaint  Patient presents with  . Hypertension  . Establish Care  . Fatigue    HPI Brian Walker presents for new patient visit to establish care.  Introduced to Publishing rights manager role and practice setting.  All questions answered.  Has not seen a PCP "in forever".  No current preventative screening up to date, including colonoscopy, low dose lung CT, or AAA screening.  HYPERTENSION Is on Amlodipine 10 MG daily, started taking two pills (10 MG) a day 2-3 weeks ago.  Is a smoker, smokes about 1 PPD.  Has quit twice in last 3 years because his wife was quitting, but when she started then he started again.  Has smoked since age 33.   Hypertension status: stable  Satisfied with current treatment? 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  Reports he has gained weight over past 3 years and has not been eating as well, lots of junk food.  Has noticed fatigue has increased over past year.  Does endorse stressors with political environment and Covid.  Divorced in 2015 and then remarried in 2017 to wife with Bipolar, which has been stressful at times.  Occasionally has been smoking MJ in evening, which does help him sleep better. Duration:   chronic Severity: mild  Onset: gradual Context when symptoms started:  unknown Symptoms improve with rest: yes  Depressive symptoms: unsure Stress/anxiety: at times Insomnia: yes hard to stay asleep Snoring: occasional Observed apnea by bed partner: none Daytime hypersomnolence: no Wakes feeling refreshed: yes History of sleep study: no Dysnea on exertion:  at times Orthopnea/PND: sleeps on side with head straight Chest pain: no Chronic cough: no Lower extremity edema: no Arthralgias:no Myalgias: no Weakness: yes, all over Rash: no   Depression screen [facility] 2/9 07/24/2019 07/24/2019  Decreased Interest 3 3  Down, Depressed, Hopeless 0 0  PHQ - 2 Score 3 3  Altered sleeping 1 1  Tired, decreased energy 3 3  Change in appetite 0 0  Feeling bad or failure about yourself  0 0  Trouble concentrating 0 0  Moving slowly or fidgety/restless 2 2  Suicidal thoughts 0 0  PHQ-9 Score 9 9  Difficult doing work/chores - Somewhat difficult   Functional Status Survey: Is the patient deaf or have difficulty hearing?: No Does the patient have difficulty seeing, even when wearing glasses/contacts?: No Does the patient have difficulty concentrating, remembering, or making decisions?: No Does the patient have difficulty walking or climbing stairs?: No Does the patient have difficulty dressing or bathing?: No Does the patient have difficulty doing errands alone such as visiting a doctor's office or shopping?: No   Past Medical History:  Diagnosis Date  . Eczema   . Hypertension     Past  Surgical History:  Procedure Laterality Date  . CHOLECYSTECTOMY N/A 08/04/2015   Procedure: LAPAROSCOPIC CHOLECYSTECTOMY;  Surgeon: Lattie Haw, MD;  Location: [facility] ORS;  Service: General;  Laterality: N/A;  . KNEE SURGERY Bilateral   . KNEE SURGERY     bilateral    Family History  Problem Relation Age of Onset  . Parkinson's disease Mother   . Cancer Father   . Cancer Sister   . Heart  disease Maternal Grandfather   . Lupus Sister     Social History   Socioeconomic History  . Marital status: Married    Spouse name: Not on file  . Number of children: Not on file  . Years of education: Not on file  . Highest education level: Not on file  Occupational History  . Not on file  Tobacco Use  . Smoking status: Current Every Day Smoker    Packs/day: 1.00    Types: Cigarettes  . Smokeless tobacco: Never Used  . Tobacco comment: started smoking at age 30  Substance and Sexual Activity  . Alcohol use: No  . Drug use: No  . Sexual activity: Yes  Other Topics Concern  . Not on file  Social History Narrative  . Not on file   Social Determinants of Health   Financial Resource Strain: Low Risk   . Difficulty of Paying Living Expenses: Not hard at all  Food Insecurity: No Food Insecurity  . Worried About Programme researcher, broadcasting/film/video in the Last Year: Never true  . Ran Out of Food in the Last Year: Never true  Transportation Needs: No Transportation Needs  . Lack of Transportation (Medical): No  . Lack of Transportation (Non-Medical): No  Physical Activity: Insufficiently Active  . Days of Exercise per Week: 4 days  . Minutes of Exercise per Session: 30 min  Stress: No Stress Concern Present  . Feeling of Stress : Not at all  Social Connections: Somewhat Isolated  . Frequency of Communication with Friends and Family: Three times a week  . Frequency of Social Gatherings with Friends and Family: Three times a week  . Attends Religious Services: Never  . Active Member of Clubs or Organizations: No  . Attends Banker Meetings: Never  . Marital Status: Married  Catering manager Violence: Not At Risk  . Fear of Current or Ex-Partner: No  . Emotionally Abused: No  . Physically Abused: No  . Sexually Abused: No    ROS Review of Systems  Constitutional: Positive for fatigue. Negative for activity change, appetite change, diaphoresis and fever.  Respiratory:  Negative for cough, chest tightness, shortness of breath and wheezing.   Cardiovascular: Negative for chest pain, palpitations and leg swelling.  Gastrointestinal: Negative for abdominal distention, abdominal pain, constipation, diarrhea and vomiting.  Endocrine: Positive for cold intolerance, heat intolerance and polyuria. Negative for polydipsia and polyphagia.  Neurological: Negative.   Psychiatric/Behavioral: Positive for decreased concentration and sleep disturbance. Negative for self-injury and suicidal ideas. The patient is nervous/anxious (at times).     Objective:   Today's Vitals: BP (!) 142/90 (BP Location: Right Arm, Cuff Size: Normal)   Pulse 67   Temp 98.7 F (37.1 C) (Oral)   Ht 5' 8.5" (1.74 m)   Wt 153 lb (69.4 kg)   SpO2 98%   BMI 22.93 kg/m   Physical Exam Vitals and nursing note reviewed.  Constitutional:      General: He is awake. He is not in acute distress.  Appearance: He is well-developed and well-groomed. He is not ill-appearing.  HENT:     Head: Normocephalic and atraumatic.     Right Ear: Hearing normal. No drainage.     Left Ear: Hearing normal. No drainage.     Mouth/Throat:     Comments: Earring to right ear present. Eyes:     General: Lids are normal.        Right eye: No discharge.        Left eye: No discharge.     Conjunctiva/sclera: Conjunctivae normal.     Pupils: Pupils are equal, round, and reactive to light.  Neck:     Thyroid: No thyromegaly.     Vascular: No carotid bruit.  Cardiovascular:     Rate and Rhythm: Normal rate and regular rhythm.     Heart sounds: Normal heart sounds, S1 normal and S2 normal. No murmur. No gallop.   Pulmonary:     Effort: Pulmonary effort is normal. No accessory muscle usage or respiratory distress.     Breath sounds: Normal breath sounds.     Comments: Intermittent dry cough noted.   Abdominal:     General: Bowel sounds are normal.     Palpations: Abdomen is soft. There is no hepatomegaly or  splenomegaly.     Tenderness: There is no abdominal tenderness.  Musculoskeletal:        General: Normal range of motion.     Cervical back: Normal range of motion and neck supple.     Right lower leg: No edema.     Left lower leg: No edema.  Lymphadenopathy:     Head:     Right side of head: No submental, submandibular, tonsillar, preauricular or posterior auricular adenopathy.     Left side of head: No submental, submandibular, tonsillar, preauricular or posterior auricular adenopathy.     Cervical: No cervical adenopathy.  Skin:    General: Skin is warm and dry.     Capillary Refill: Capillary refill takes less than 2 seconds.  Neurological:     Mental Status: He is alert and oriented to person, place, and time.     Deep Tendon Reflexes: Reflexes are normal and symmetric.     Reflex Scores:      Brachioradialis reflexes are 2+ on the right side and 2+ on the left side.      Patellar reflexes are 2+ on the right side and 2+ on the left side. Psychiatric:        Attention and Perception: Attention normal.        Mood and Affect: Mood normal.        Speech: Speech normal.        Behavior: Behavior normal. Behavior is cooperative.        Thought Content: Thought content normal.        Cognition and Memory: Cognition normal.        Judgment: Judgment normal.     Assessment & Plan:   Problem List Items Addressed This Visit      Cardiovascular and Mediastinum   Essential hypertension    Chronic, ongoing.  BP not quite at goal, but recently increased Amlodipine to 10 MG with some improvement reported.  Continue this dose, refills sent. Obtain CMP and TSH today.  Consider addition of Losartan in future if continued elevation, would avoid ACE in smoker.  Recommend continue to check BP at home and complete smoking cessation.  Return in 4 weeks for annual physical.  Relevant Medications   amLODipine (NORVASC) 10 MG tablet   Other Relevant Orders   Comprehensive metabolic panel       Other   Polyuria    Along with ongoing fatigue, will check A1C.  No UTI symptoms present.      Relevant Orders   HgB A1c   Fatigue    Chronic over past 3-4 years.  Appears multifactorial with mood, HTN, and smoker.  Will check TSH (does report cold/heat intolerance), CBC, CMP, A1C.  Does not wish to start mood medication at this time.  Did recommend cut back on MJ and try Melatonin for sleep at HS.  May benefit from sleep study, will discuss further next visit.      Relevant Orders   CBC with Differential/Platelet out   TSH   HgB A1c   Screening cholesterol level    High cardiac risk with smoking and HTN, will check lipid panel today since is fasting.      Relevant Orders   Lipid Panel w/o Chol/HDL Ratio out   Nicotine dependence, cigarettes, uncomplicated    I have recommended complete cessation of tobacco use. I have discussed various options available for assistance with tobacco cessation including over the counter methods (Nicotine gum, patch and lozenges). We also discussed prescription options (Chantix, Nicotine Inhaler / Nasal Spray). The patient is not interested in pursuing any prescription tobacco cessation options at this time.  - At physical plan to order CT scan low dose lungs and AAA screening       Preventative health care    At next visit, annual physical 4 weeks, will require: - colonoscopy ordered - AAA screening - Low dose lung CT order - Spirometry, long time smoker, suspect underlying COPD       Other Visit Diagnoses    Encounter to establish care    -  Primary      Outpatient Encounter Medications as of 07/24/2019  Medication Sig  . [DISCONTINUED] amLODipine (NORVASC) 5 MG tablet Take 1 tablet (5 mg total) by mouth daily. (Patient taking differently: Take 5 mg by mouth daily. )  . amLODipine (NORVASC) 10 MG tablet Take 1 tablet (10 mg total) by mouth daily.  . [DISCONTINUED] fluocinonide cream (LIDEX) 0.05 % Apply 1 application topically 2  (two) times daily.   No facility-administered encounter medications on file as of 07/24/2019.    Follow-up: Return in about 4 weeks (around 08/21/2019) for Annual physical with spirometry.   Marjie Skiff, NP

## 2019-07-24 NOTE — Assessment & Plan Note (Signed)
Chronic, ongoing.  BP not quite at goal, but recently increased Amlodipine to 10 MG with some improvement reported.  Continue this dose, refills sent. Obtain CMP and TSH today.  Consider addition of Losartan in future if continued elevation, would avoid ACE in smoker.  Recommend continue to check BP at home and complete smoking cessation.  Return in 4 weeks for annual physical.

## 2019-07-24 NOTE — Assessment & Plan Note (Signed)
High cardiac risk with smoking and HTN, will check lipid panel today since is fasting.

## 2019-07-24 NOTE — Assessment & Plan Note (Signed)
Chronic over past 3-4 years.  Appears multifactorial with mood, HTN, and smoker.  Will check TSH (does report cold/heat intolerance), CBC, CMP, A1C.  Does not wish to start mood medication at this time.  Did recommend cut back on MJ and try Melatonin for sleep at HS.  May benefit from sleep study, will discuss further next visit.

## 2019-07-25 LAB — COMPREHENSIVE METABOLIC PANEL
ALT: 17 IU/L (ref 0–44)
AST: 22 IU/L (ref 0–40)
Albumin/Globulin Ratio: 1.6 (ref 1.2–2.2)
Albumin: 4.2 g/dL (ref 3.8–4.9)
Alkaline Phosphatase: 92 IU/L (ref 39–117)
BUN/Creatinine Ratio: 6 — ABNORMAL LOW (ref 10–24)
BUN: 7 mg/dL — ABNORMAL LOW (ref 8–27)
Bilirubin Total: 0.5 mg/dL (ref 0.0–1.2)
CO2: 25 mmol/L (ref 20–29)
Calcium: 9.5 mg/dL (ref 8.6–10.2)
Chloride: 100 mmol/L (ref 96–106)
Creatinine, Ser: 1.1 mg/dL (ref 0.76–1.27)
GFR calc Af Amer: 84 mL/min/{1.73_m2} (ref >59)
GFR calc non Af Amer: 73 mL/min/{1.73_m2} (ref >59)
Globulin, Total: 2.7 g/dL (ref 1.5–4.5)
Glucose: 98 mg/dL (ref 65–99)
Potassium: 3.6 mmol/L (ref 3.5–5.2)
Sodium: 138 mmol/L (ref 134–144)
Total Protein: 6.9 g/dL (ref 6.0–8.5)

## 2019-07-25 LAB — CBC WITH DIFFERENTIAL/PLATELET
Basophils Absolute: 0.2 10*3/uL (ref 0.0–0.2)
Basos: 2 %
EOS (ABSOLUTE): 0.2 10*3/uL (ref 0.0–0.4)
Eos: 3 %
Hematocrit: 49.2 % (ref 37.5–51.0)
Hemoglobin: 16.5 g/dL (ref 13.0–17.7)
Immature Grans (Abs): 0 10*3/uL (ref 0.0–0.1)
Immature Granulocytes: 0 %
Lymphocytes Absolute: 2.8 10*3/uL (ref 0.7–3.1)
Lymphs: 34 %
MCH: 30.3 pg (ref 26.6–33.0)
MCHC: 33.5 g/dL (ref 31.5–35.7)
MCV: 90 fL (ref 79–97)
Monocytes Absolute: 0.6 10*3/uL (ref 0.1–0.9)
Monocytes: 7 %
Neutrophils Absolute: 4.5 10*3/uL (ref 1.4–7.0)
Neutrophils: 54 %
Platelets: 305 10*3/uL (ref 150–450)
RBC: 5.44 x10E6/uL (ref 4.14–5.80)
RDW: 14.7 % (ref 11.6–15.4)
WBC: 8.3 10*3/uL (ref 3.4–10.8)

## 2019-07-25 LAB — LIPID PANEL W/O CHOL/HDL RATIO
Cholesterol, Total: 177 mg/dL (ref 100–199)
HDL: 54 mg/dL (ref >39)
LDL Chol Calc (NIH): 102 mg/dL — ABNORMAL HIGH (ref 0–99)
Triglycerides: 116 mg/dL (ref 0–149)
VLDL Cholesterol Cal: 21 mg/dL (ref 5–40)

## 2019-07-25 LAB — HEMOGLOBIN A1C
Est. average glucose Bld gHb Est-mCnc: 114 mg/dL
Hgb A1c MFr Bld: 5.6 % (ref 4.8–5.6)

## 2019-07-25 LAB — TSH: TSH: 0.647 u[IU]/mL (ref 0.450–4.500)

## 2019-07-25 NOTE — Progress Notes (Signed)
Contacted via MyChart The 10-year ASCVD risk score [name], et al., 2013) is: 16%   Values used to calculate the score:     Age: 59 years     Sex: Male     Is Non-Hispanic African American: No     Diabetic: No     Tobacco smoker: Yes     Systolic Blood Pressure: 675 mmHg     Is BP treated: Yes     HDL Cholesterol: 54 mg/dL     Total Cholesterol: 177 mg/dL

## 2019-09-04 ENCOUNTER — Ambulatory Visit (INDEPENDENT_AMBULATORY_CARE_PROVIDER_SITE_OTHER): Payer: BC Managed Care – PPO | Admitting: Nurse Practitioner

## 2019-09-04 ENCOUNTER — Encounter: Payer: Self-pay | Admitting: Nurse Practitioner

## 2019-09-04 ENCOUNTER — Other Ambulatory Visit: Payer: Self-pay

## 2019-09-04 VITALS — BP 138/81 | HR 70 | Temp 98.3°F | Ht 69.29 in | Wt 158.4 lb

## 2019-09-04 DIAGNOSIS — F17210 Nicotine dependence, cigarettes, uncomplicated: Secondary | ICD-10-CM | POA: Diagnosis not present

## 2019-09-04 DIAGNOSIS — E782 Mixed hyperlipidemia: Secondary | ICD-10-CM | POA: Insufficient documentation

## 2019-09-04 DIAGNOSIS — Z1159 Encounter for screening for other viral diseases: Secondary | ICD-10-CM

## 2019-09-04 DIAGNOSIS — Z114 Encounter for screening for human immunodeficiency virus [HIV]: Secondary | ICD-10-CM

## 2019-09-04 DIAGNOSIS — E538 Deficiency of other specified B group vitamins: Secondary | ICD-10-CM | POA: Diagnosis not present

## 2019-09-04 DIAGNOSIS — I10 Essential (primary) hypertension: Secondary | ICD-10-CM | POA: Diagnosis not present

## 2019-09-04 DIAGNOSIS — Z125 Encounter for screening for malignant neoplasm of prostate: Secondary | ICD-10-CM

## 2019-09-04 DIAGNOSIS — Z0000 Encounter for general adult medical examination without abnormal findings: Secondary | ICD-10-CM | POA: Diagnosis not present

## 2019-09-04 DIAGNOSIS — E7800 Pure hypercholesterolemia, unspecified: Secondary | ICD-10-CM

## 2019-09-04 DIAGNOSIS — E559 Vitamin D deficiency, unspecified: Secondary | ICD-10-CM | POA: Diagnosis not present

## 2019-09-04 DIAGNOSIS — Z1211 Encounter for screening for malignant neoplasm of colon: Secondary | ICD-10-CM

## 2019-09-04 DIAGNOSIS — Z122 Encounter for screening for malignant neoplasm of respiratory organs: Secondary | ICD-10-CM

## 2019-09-04 MED ORDER — ALBUTEROL SULFATE HFA 108 (90 BASE) MCG/ACT IN AERS: 2.0000 | INHALATION_SPRAY | Freq: Four times a day (QID) | RESPIRATORY_TRACT | 2 refills | Status: DC | PRN

## 2019-09-04 MED ORDER — LOSARTAN POTASSIUM 25 MG PO TABS: 25.0000 mg | ORAL_TABLET | Freq: Every day | ORAL | 3 refills | Status: DC

## 2019-09-04 NOTE — Assessment & Plan Note (Signed)
I have recommended complete cessation of tobacco use. I have discussed various options available for assistance with tobacco cessation including over the counter methods (Nicotine gum, patch and lozenges). We also discussed prescription options (Chantix, Nicotine Inhaler / Nasal Spray). The patient is not interested in pursuing any prescription tobacco cessation options at this time.  

## 2019-09-04 NOTE — Assessment & Plan Note (Signed)
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  Continue diet focus at this time and recommend complete cessation of smoking.  Will further discuss at visits adding on low dose statin for prevention.

## 2019-09-04 NOTE — Assessment & Plan Note (Signed)
NEEDS: - colonoscopy ordered  - Low dose lung CT ordered - Spirometry, long time smoker, suspect underlying COPD -- order next visit

## 2019-09-04 NOTE — Patient Instructions (Signed)
 Health Maintenance, Male Adopting a healthy lifestyle and getting preventive care are important in promoting health and wellness. Ask your health care provider about:  The right schedule for you to have regular tests and exams.  Things you can do on your own to prevent diseases and keep yourself healthy. What should I know about diet, weight, and exercise? Eat a healthy diet   Eat a diet that includes plenty of vegetables, fruits, low-fat dairy products, and lean protein.  Do not eat a lot of foods that are high in solid fats, added sugars, or sodium. Maintain a healthy weight Body mass index (BMI) is a measurement that can be used to identify possible weight problems. It estimates body fat based on height and weight. Your health care provider can help determine your BMI and help you achieve or maintain a healthy weight. Get regular exercise Get regular exercise. This is one of the most important things you can do for your health. Most adults should:  Exercise for at least 150 minutes each week. The exercise should increase your heart rate and make you sweat (moderate-intensity exercise).  Do strengthening exercises at least twice a week. This is in addition to the moderate-intensity exercise.  Spend less time sitting. Even light physical activity can be beneficial. Watch cholesterol and blood lipids Have your blood tested for lipids and cholesterol at 61 years of age, then have this test every 5 years. You may need to have your cholesterol levels checked more often if:  Your lipid or cholesterol levels are high.  You are older than 61 years of age.  You are at high risk for heart disease. What should I know about cancer screening? Many types of cancers can be detected early and may often be prevented. Depending on your health history and family history, you may need to have cancer screening at various ages. This may include screening for:  Colorectal cancer.  Prostate  cancer.  Skin cancer.  Lung cancer. What should I know about heart disease, diabetes, and high blood pressure? Blood pressure and heart disease  High blood pressure causes heart disease and increases the risk of stroke. This is more likely to develop in people who have high blood pressure readings, are of African descent, or are overweight.  Talk with your health care provider about your target blood pressure readings.  Have your blood pressure checked: ? Every 3-5 years if you are 18-39 years of age. ? Every year if you are 40 years old or older.  If you are between the ages of 65 and 75 and are a current or former smoker, ask your health care provider if you should have a one-time screening for abdominal aortic aneurysm (AAA). Diabetes Have regular diabetes screenings. This checks your fasting blood sugar level. Have the screening done:  Once every three years after age 45 if you are at a normal weight and have a low risk for diabetes.  More often and at a younger age if you are overweight or have a high risk for diabetes. What should I know about preventing infection? Hepatitis B If you have a higher risk for hepatitis B, you should be screened for this virus. Talk with your health care provider to find out if you are at risk for hepatitis B infection. Hepatitis C Blood testing is recommended for:  Everyone born from 1945 through 1965.  Anyone with known risk factors for hepatitis C. Sexually transmitted infections (STIs)  You should be screened each   year for STIs, including gonorrhea and chlamydia, if: ? You are sexually active and are younger than 61 years of age. ? You are older than 61 years of age and your health care provider tells you that you are at risk for this type of infection. ? Your sexual activity has changed since you were last screened, and you are at increased risk for chlamydia or gonorrhea. Ask your health care provider if you are at risk.  Ask your  health care provider about whether you are at high risk for HIV. Your health care provider may recommend a prescription medicine to help prevent HIV infection. If you choose to take medicine to prevent HIV, you should first get tested for HIV. You should then be tested every 3 months for as long as you are taking the medicine. Follow these instructions at home: Lifestyle  Do not use any products that contain nicotine or tobacco, such as cigarettes, e-cigarettes, and chewing tobacco. If you need help quitting, ask your health care provider.  Do not use street drugs.  Do not share needles.  Ask your health care provider for help if you need support or information about quitting drugs. Alcohol use  Do not drink alcohol if your health care provider tells you not to drink.  If you drink alcohol: ? Limit how much you have to 0-2 drinks a day. ? Be aware of how much alcohol is in your drink. In the [place] one drink equals one 12 oz bottle of beer (355 mL), one 5 oz glass of wine (148 mL), or one 1 oz glass of hard liquor (44 mL). General instructions  Schedule regular health, dental, and eye exams.  Stay current with your vaccines.  Tell your health care provider if: ? You often feel depressed. ? You have ever been abused or do not feel safe at home. Summary  Adopting a healthy lifestyle and getting preventive care are important in promoting health and wellness.  Follow your health care provider's instructions about healthy diet, exercising, and getting tested or screened for diseases.  Follow your health care provider's instructions on monitoring your cholesterol and blood pressure. This information is not intended to replace advice given to you by your health care provider. Make sure you discuss any questions you have with your health care provider. Document Revised: 07/04/2018 Document Reviewed: 07/04/2018 Elsevier Patient Education  2020 [employer].   Colonoscopy, Adult A  colonoscopy is an exam to look at the large intestine. It is done using a long, thin, flexible tube that has a camera on the end. This exam is done to check for problems, such as:  Lumps (tumors).  Growths (polyps).  Irritation and swelling (inflammation).  Bleeding. Tell your doctor about:  Any allergies you have.  All medicines you are taking. Tell him or her about vitamins, herbs, eye drops, creams, and over-the-counter medicines.  Any problems you or family members have had with anesthetic medicines.  Any blood disorders you have.  Any surgeries you have had.  Any medical conditions you have.  Whether you are pregnant or may be pregnant.  Any problems you have had with pooping (having bowel movements). What are the risks? Generally, this is a safe procedure. However, problems may occur, such as:  Bleeding.  Damage to your intestine.  Allergic reactions to medicines given during the procedure.  Infection. This is rare. What happens before the procedure? Eating and drinking Follow instructions from your doctor about eating and drinking. These may  include:  A few days before the procedure: ? Follow a low-fiber diet. ? Avoid these foods:  Nuts.  Seeds.  Dried fruit.  Raw fruits.  Vegetables.  1-3 days before the procedure: ? Eat only gelatin dessert or ice pops. ? Drink only clear liquids, such as:  Water.  Clear broth or bouillon.  Black coffee or tea.  Clear juice.  Clear soft drinks or sports drinks. ? Avoid liquids that have red or purple dye.  On the day of the procedure: ? Do not eat solid foods. You may continue to drink clear liquids until up to 2 hours before the procedure. ? Do not eat or drink anything starting 2 hours before the procedure or as told by your doctor. Bowel prep If you were prescribed a bowel prep to take by mouth (orally) to clean out your colon:  Take it as told by your doctor. Starting the day before your  procedure, you will need to drink a lot of liquid medicine. The liquid will cause you to poop until your poop is almost clear or light green.  If your skin or butt gets irritated from diarrhea, you may: ? Wipe the area with wipes that have medicine in them, such as adult wet wipes with aloe and vitamin E. ? Put something on your skin that soothes the area, such as petroleum jelly.  If you vomit while drinking the bowel prep: ? Take a break for up to 60 minutes. ? Begin the bowel prep again. ? Call your doctor if you keep vomiting and you cannot take the bowel prep without vomiting.  To clean out your colon, you may also be given: ? Laxative medicines. These help you poop. ? Instructions for using a liquid medicine (enema) injected into your butt. Medicines Ask your doctor about:  Changing or stopping your normal medicines. This is important.  Taking aspirin and ibuprofen. Do not take these medicines unless your doctor tells you to take them.  Taking over-the-counter medicines, vitamins, herbs, and supplements. General instructions  Ask your doctor what steps will be taken to help prevent the spread of germs. These may include washing skin with a germ-killing soap.  Plan to have someone take you home from the hospital or clinic. What happens during the procedure?   An IV tube may be put into one of your veins.  You may be given one or more of the following: ? A medicine to help you relax (sedative). ? A medicine to numb the area (local anesthetic). ? A medicine to make you fall asleep (general anesthetic). This is rarely needed.  You will lie on your side with your knees bent.  The tube will: ? Have oil or gel put on it. ? Be put into the opening of the butt (anus). ? Be gently put into your large intestine.  Air will be sent into your colon to keep it open. You may feel some pressure or cramping.  The camera will be used to take photos that will appear on a screen.  A  small tissue sample may be removed for testing (biopsy).  If small growths are found, your doctor may remove them and have them checked for cancer.  The tube will be slowly removed. The procedure may vary among doctors and hospitals. What happens after the procedure?  You will be monitored until you leave the hospital or clinic. This includes checking your: ? Blood pressure. ? Heart rate. ? Breathing rate. ? Blood oxygen level.  You may have a small amount of blood in your poop.  You may pass gas.  You may have mild cramps or bloating in your belly (abdomen).  Do not drive for 24 hours after the procedure.  It is up to you to get the results of your procedure. Ask your doctor, or the department that is doing the procedure, when your results will be ready. Summary  A colonoscopy is an exam to look at the large intestine.  Follow instructions from your doctor about eating and drinking before the procedure.  You may be prescribed an oral bowel prep to clean out your colon. Take it as told by your doctor.  A flexible tube with a camera on its end will be put into the opening of the butt. It will be passed into the large intestine.  You will be monitored until you leave the hospital or clinic. This information is not intended to replace advice given to you by your health care provider. Make sure you discuss any questions you have with your health care provider. Document Revised: 02/01/2019 Document Reviewed: 02/01/2019 Elsevier Patient Education  [employer].

## 2019-09-04 NOTE — Assessment & Plan Note (Signed)
Chronic, ongoing.  Continues to be above goal, <130/80.  Informed to take Amlodipine 10 MG daily only. Will start Losartan 25 MG daily, discussed with patient and educated.  Script sent.  Continue to check BP daily and document at home.  BMP today.  Return in 4 weeks.

## 2019-09-04 NOTE — Progress Notes (Signed)
BP 138/81 (BP Location: Right Arm, Patient Position: Sitting, Cuff Size: Normal)   Pulse 70   Temp 98.3 F (36.8 C) (Oral)   Ht 5' 9.29" (1.76 m)   Wt 158 lb 6.4 oz (71.8 kg)   SpO2 96%   BMI 23.20 kg/m    Subjective:    Patient ID: Brian Walker, male    DOB: 08/15/1960, 59 y.o.   MRN: [id number]  HPI: Brian ShuckWilliam C Oliveri is a 59 y.o. male presenting on 09/04/2019 for comprehensive medical examination. Current medical complaints include:none  He currently lives with: wife Interim Problems from his last visit: no   HYPERTENSION Continues on Amlodipine, had been taking 10 MG in evening + reports he started trying 5 MG in morning if his BP was high.  Discussed with him that this was over dose recommended.  Smoking less than 1 PPD at this time, has been smoking since age 89.   Hypertension status: stable  Satisfied with current treatment? yes Duration of hypertension: chronic BP monitoring frequency:  daily BP range: 138-146/80-90 at home BP medication side effects:  no Medication compliance: good compliance Aspirin: no Recurrent headaches: no Visual changes: no Palpitations: no Dyspnea: occasional, when was 18 was on an inhaler Chest pain: no Lower extremity edema: no Dizzy/lightheaded: no  The 10-year ASCVD risk score Denman George(Goff DC Jr., et al., 2013) is: 15.3%   Values used to calculate the score:     Age: 89 years     Sex: Male     Is Non-Hispanic African American: No     Diabetic: No     Tobacco smoker: Yes     Systolic Blood Pressure: 138 mmHg     Is BP treated: Yes     HDL Cholesterol: 54 mg/dL     Total Cholesterol: 177 mg/dL   Functional Status Survey: Is the patient deaf or have difficulty hearing?: No Does the patient have difficulty seeing, even when wearing glasses/contacts?: No Does the patient have difficulty concentrating, remembering, or making decisions?: No Does the patient have difficulty walking or climbing stairs?: No Does the patient have  difficulty dressing or bathing?: No Does the patient have difficulty doing errands alone such as visiting a doctor's office or shopping?: No  FALL RISK: Fall Risk  07/24/2019  Falls in the past year? 0  Number falls in past yr: 0  Injury with Fall? 0    Depression Screen Depression screen [facility] 09/04/2019 07/24/2019 07/24/2019  Decreased Interest 0 3 3  Down, Depressed, Hopeless 0 0 0  PHQ - 2 Score 0 3 3  Altered sleeping 1 1 1   Tired, decreased energy 1 3 3   Change in appetite 0 0 0  Feeling bad or failure about yourself  0 0 0  Trouble concentrating 1 0 0  Moving slowly or fidgety/restless 0 2 2  Suicidal thoughts 0 0 0  PHQ-9 Score 3 9 9   Difficult doing work/chores Not difficult at all - Somewhat difficult    Advanced Directives <no information>  Past Medical History:  Past Medical History:  Diagnosis Date  . Eczema   . Hypertension     Surgical History:  Past Surgical History:  Procedure Laterality Date  . CHOLECYSTECTOMY N/A 08/04/2015   Procedure: LAPAROSCOPIC CHOLECYSTECTOMY;  Surgeon: Lattie Hawichard E Cooper, MD;  Location: [facility] ORS;  Service: General;  Laterality: N/A;  . KNEE SURGERY Bilateral   . KNEE SURGERY     bilateral    Medications:  Current Outpatient  Medications on File Prior to Visit  Medication Sig  . amLODipine (NORVASC) 10 MG tablet Take 1 tablet (10 mg total) by mouth daily.   No current facility-administered medications on file prior to visit.    Allergies:  Allergies  Allergen Reactions  . Codeine Nausea And Vomiting and Rash  . Penicillins Nausea And Vomiting, Swelling, Rash and Other (See Comments)    Pt states that this medication caused his throat to swell.   Has patient had a PCN reaction causing immediate rash, facial/tongue/throat swelling, SOB or lightheadedness with hypotension: Yes Has patient had a PCN reaction causing severe rash involving mucus membranes or skin necrosis: No Has patient had a PCN reaction that required  hospitalization No Has patient had a PCN reaction occurring within the last 10 years: No If all of the above answers are "NO", then may proceed with Cephalosporin use.    Social History:  Social History   Socioeconomic History  . Marital status: Married    Spouse name: Not on file  . Number of children: Not on file  . Years of education: Not on file  . Highest education level: Not on file  Occupational History  . Not on file  Tobacco Use  . Smoking status: Current Every Day Smoker    Packs/day: 1.00    Types: Cigarettes  . Smokeless tobacco: Never Used  . Tobacco comment: started smoking at age 48  Substance and Sexual Activity  . Alcohol use: No  . Drug use: No  . Sexual activity: Yes  Other Topics Concern  . Not on file  Social History Narrative  . Not on file   Social Determinants of Health   Financial Resource Strain: Low Risk   . Difficulty of Paying Living Expenses: Not hard at all  Food Insecurity: No Food Insecurity  . Worried About Programme researcher, broadcasting/film/video in the Last Year: Never true  . Ran Out of Food in the Last Year: Never true  Transportation Needs: No Transportation Needs  . Lack of Transportation (Medical): No  . Lack of Transportation (Non-Medical): No  Physical Activity: Insufficiently Active  . Days of Exercise per Week: 4 days  . Minutes of Exercise per Session: 30 min  Stress: No Stress Concern Present  . Feeling of Stress : Not at all  Social Connections: Somewhat Isolated  . Frequency of Communication with Friends and Family: Three times a week  . Frequency of Social Gatherings with Friends and Family: Three times a week  . Attends Religious Services: Never  . Active Member of Clubs or Organizations: No  . Attends Banker Meetings: Never  . Marital Status: Married  Catering manager Violence: Not At Risk  . Fear of Current or Ex-Partner: No  . Emotionally Abused: No  . Physically Abused: No  . Sexually Abused: No   Social  History   Tobacco Use  Smoking Status Current Every Day Smoker  . Packs/day: 1.00  . Types: Cigarettes  Smokeless Tobacco Never Used  Tobacco Comment   started smoking at age 1   Social History   Substance and Sexual Activity  Alcohol Use No    Family History:  Family History  Problem Relation Age of Onset  . Parkinson's disease Mother   . Cancer Father   . Cancer Sister   . Heart disease Maternal Grandfather   . Lupus Sister     Past medical history, surgical history, medications, allergies, family history and social history reviewed with patient  today and changes made to appropriate areas of the chart.   Review of Systems -  negative All other ROS negative except what is listed above and in the HPI.      Objective:    BP 138/81 (BP Location: Right Arm, Patient Position: Sitting, Cuff Size: Normal)   Pulse 70   Temp 98.3 F (36.8 C) (Oral)   Ht 5' 9.29" (1.76 m)   Wt 158 lb 6.4 oz (71.8 kg)   SpO2 96%   BMI 23.20 kg/m   Wt Readings from Last 3 Encounters:  09/04/19 158 lb 6.4 oz (71.8 kg)  07/24/19 153 lb (69.4 kg)  06/24/19 149 lb 14.6 oz (68 kg)    Physical Exam Vitals and nursing note reviewed.  Constitutional:      General: He is awake. He is not in acute distress.    Appearance: He is well-developed and well-groomed. He is not ill-appearing.  HENT:     Head: Normocephalic and atraumatic.     Right Ear: Hearing, tympanic membrane, ear canal and external ear normal. No drainage.     Left Ear: Hearing, tympanic membrane, ear canal and external ear normal. No drainage.     Nose: Nose normal.     Mouth/Throat:     Pharynx: Uvula midline.  Eyes:     General: Lids are normal.        Right eye: No discharge.        Left eye: No discharge.     Extraocular Movements: Extraocular movements intact.     Conjunctiva/sclera: Conjunctivae normal.     Pupils: Pupils are equal, round, and reactive to light.     Visual Fields: Right eye visual fields normal and  left eye visual fields normal.  Neck:     Thyroid: No thyromegaly.     Vascular: No carotid bruit or JVD.     Trachea: Trachea normal.  Cardiovascular:     Rate and Rhythm: Normal rate and regular rhythm.     Heart sounds: Normal heart sounds, S1 normal and S2 normal. No murmur. No gallop.   Pulmonary:     Effort: Pulmonary effort is normal. No accessory muscle usage or respiratory distress.     Breath sounds: Normal breath sounds.  Abdominal:     General: Bowel sounds are normal.     Palpations: Abdomen is soft. There is no hepatomegaly or splenomegaly.     Tenderness: There is no abdominal tenderness.  Musculoskeletal:        General: Normal range of motion.     Cervical back: Normal range of motion and neck supple.     Right lower leg: No edema.     Left lower leg: No edema.  Lymphadenopathy:     Head:     Right side of head: No submental, submandibular, tonsillar, preauricular or posterior auricular adenopathy.     Left side of head: No submental, submandibular, tonsillar, preauricular or posterior auricular adenopathy.     Cervical: No cervical adenopathy.  Skin:    General: Skin is warm and dry.     Capillary Refill: Capillary refill takes less than 2 seconds.     Findings: No rash.  Neurological:     Mental Status: He is alert and oriented to person, place, and time.     Cranial Nerves: Cranial nerves are intact.     Gait: Gait is intact.     Deep Tendon Reflexes: Reflexes are normal and symmetric.     Reflex  Scores:      Brachioradialis reflexes are 2+ on the right side and 2+ on the left side.      Patellar reflexes are 2+ on the right side and 2+ on the left side. Psychiatric:        Attention and Perception: Attention normal.        Mood and Affect: Mood normal.        Speech: Speech normal.        Behavior: Behavior normal. Behavior is cooperative.        Thought Content: Thought content normal.        Cognition and Memory: Cognition normal.        Judgment:  Judgment normal.     Results for orders placed or performed in visit on 07/24/19  CBC with Differential/Platelet out  Result Value Ref Range   WBC 8.3 3.4 - 10.8 x10E3/uL   RBC 5.44 4.14 - 5.80 x10E6/uL   Hemoglobin 16.5 13.0 - 17.7 g/dL   Hematocrit 75.9 16.3 - 51.0 %   MCV 90 79 - 97 fL   MCH 30.3 26.6 - 33.0 pg   MCHC 33.5 31.5 - 35.7 g/dL   RDW 84.6 65.9 - 93.5 %   Platelets 305 150 - 450 x10E3/uL   Neutrophils 54 Not Estab. %   Lymphs 34 Not Estab. %   Monocytes 7 Not Estab. %   Eos 3 Not Estab. %   Basos 2 Not Estab. %   Neutrophils Absolute 4.5 1.4 - 7.0 x10E3/uL   Lymphocytes Absolute 2.8 0.7 - 3.1 x10E3/uL   Monocytes Absolute 0.6 0.1 - 0.9 x10E3/uL   EOS (ABSOLUTE) 0.2 0.0 - 0.4 x10E3/uL   Basophils Absolute 0.2 0.0 - 0.2 x10E3/uL   Immature Granulocytes 0 Not Estab. %   Immature Grans (Abs) 0.0 0.0 - 0.1 x10E3/uL  Comprehensive metabolic panel  Result Value Ref Range   Glucose 98 65 - 99 mg/dL   BUN 7 (L) 8 - 27 mg/dL   Creatinine, Ser 7.01 0.76 - 1.27 mg/dL   GFR calc non Af Amer 73 >59 mL/min/1.73   GFR calc Af Amer 84 >59 mL/min/1.73   BUN/Creatinine Ratio 6 (L) 10 - 24   Sodium 138 134 - 144 mmol/L   Potassium 3.6 3.5 - 5.2 mmol/L   Chloride 100 96 - 106 mmol/L   CO2 25 20 - 29 mmol/L   Calcium 9.5 8.6 - 10.2 mg/dL   Total Protein 6.9 6.0 - 8.5 g/dL   Albumin 4.2 3.8 - 4.9 g/dL   Globulin, Total 2.7 1.5 - 4.5 g/dL   Albumin/Globulin Ratio 1.6 1.2 - 2.2   Bilirubin Total 0.5 0.0 - 1.2 mg/dL   Alkaline Phosphatase 92 39 - 117 IU/L   AST 22 0 - 40 IU/L   ALT 17 0 - 44 IU/L  TSH  Result Value Ref Range   TSH 0.647 0.450 - 4.500 uIU/mL  Lipid Panel w/o Chol/HDL Ratio out  Result Value Ref Range   Cholesterol, Total 177 100 - 199 mg/dL   Triglycerides 779 0 - 149 mg/dL   HDL 54 >39 mg/dL   VLDL Cholesterol Cal 21 5 - 40 mg/dL   LDL Chol Calc (NIH) 030 (H) 0 - 99 mg/dL  HgB S9Q  Result Value Ref Range   Hgb A1c MFr Bld 5.6 4.8 - 5.6 %   Est.  average glucose Bld gHb Est-mCnc 114 mg/dL      Assessment & Plan:   Problem  List Items Addressed This Visit      Cardiovascular and Mediastinum   Essential hypertension    Chronic, ongoing.  Continues to be above goal, <130/80.  Informed to take Amlodipine 10 MG daily only. Will start Losartan 25 MG daily, discussed with patient and educated.  Script sent.  Continue to check BP daily and document at home.  BMP today.  Return in 4 weeks.      Relevant Medications   losartan (COZAAR) 25 MG tablet   Other Relevant Orders   Basic Metabolic Panel (BMET)     Other   Nicotine dependence, cigarettes, uncomplicated    I have recommended complete cessation of tobacco use. I have discussed various options available for assistance with tobacco cessation including over the counter methods (Nicotine gum, patch and lozenges). We also discussed prescription options (Chantix, Nicotine Inhaler / Nasal Spray). The patient is not interested in pursuing any prescription tobacco cessation options at this time.       Relevant Orders   Ambulatory Referral for Lung Cancer Scre   Preventative health care    NEEDS: - colonoscopy ordered  - Low dose lung CT ordered - Spirometry, long time smoker, suspect underlying COPD -- order next visit      Elevated LDL cholesterol level    ASCVD 15.3%.  Continue diet focus at this time and recommend complete cessation of smoking.  Will further discuss at visits adding on low dose statin for prevention.       Other Visit Diagnoses    Annual physical exam    -  Primary   PSA ordered today, CMP, CBC, TSH, and lipid at last visit.   Vitamin D deficiency       Reports history of low level, will check this today due to recent fatigue complaint.   Relevant Orders   VITAMIN D 25 Hydroxy (Vit-D Deficiency, Fractures)   Vitamin B12 deficiency       Reports history of low level, will check this today due to recent fatigue complaint.   Relevant Orders   B12   Colon  cancer screening       GI referral placed.   Relevant Orders   Ambulatory referral to Gastroenterology   Need for hepatitis C screening test       Hep C lab ordered.   Relevant Orders   Hepatitis C antibody   Encounter for screening for HIV       HIV lab ordered.   Relevant Orders   HIV Antibody (routine testing w rflx)   Prostate cancer screening       PSA ordered, at risk due to long term smoking.   Relevant Orders   PSA   Encounter for screening for lung cancer       Referral for screening, long time smoker.   Relevant Orders   Ambulatory Referral for Lung Cancer Scre      Discussed aspirin prophylaxis for myocardial infarction prevention and decision was it was not indicated  LABORATORY TESTING:  Health maintenance labs ordered today as discussed above.   The natural history of prostate cancer and ongoing controversy regarding screening and potential treatment outcomes of prostate cancer has been discussed with the patient. The meaning of a false positive PSA and a false negative PSA has been discussed. He indicates understanding of the limitations of this screening test and wishes to proceed with screening PSA testing.   IMMUNIZATIONS:   - Tdap: Tetanus vaccination status reviewed: refused today. - Influenza: Refused -  Pneumovax: Refused - Prevnar: Not applicable - Zostavax vaccine: Refused  SCREENING: - Colonoscopy: Ordered today  Discussed with patient purpose of the colonoscopy is to detect colon cancer at curable precancerous or early stages   - AAA Screening: Not applicable  -Hearing Test: Not applicable  -Spirometry: Not applicable   PATIENT COUNSELING:    Sexuality: Discussed sexually transmitted diseases, partner selection, use of condoms, avoidance of unintended pregnancy  and contraceptive alternatives.   Advised to avoid cigarette smoking.  I discussed with the patient that most people either abstain from alcohol or drink within safe limits  (<=14/week and <=4 drinks/occasion for males, <=7/weeks and <= 3 drinks/occasion for females) and that the risk for alcohol disorders and other health effects rises proportionally with the number of drinks per week and how often a drinker exceeds daily limits.  Discussed cessation/primary prevention of drug use and availability of treatment for abuse.   Diet: Encouraged to adjust caloric intake to maintain  or achieve ideal body weight, to reduce intake of dietary saturated fat and total fat, to limit sodium intake by avoiding high sodium foods and not adding table salt, and to maintain adequate dietary potassium and calcium preferably from fresh fruits, vegetables, and low-fat dairy products.    stressed the importance of regular exercise  Injury prevention: Discussed safety belts, safety helmets, smoke detector, smoking near bedding or upholstery.   Dental health: Discussed importance of regular tooth brushing, flossing, and dental visits.   Follow up plan: NEXT PREVENTATIVE PHYSICAL DUE IN 1 YEAR. Return in about 4 weeks (around 10/02/2019) for HTN follow-up.

## 2019-09-05 ENCOUNTER — Encounter: Payer: Self-pay | Admitting: Nurse Practitioner

## 2019-09-05 DIAGNOSIS — E559 Vitamin D deficiency, unspecified: Secondary | ICD-10-CM | POA: Insufficient documentation

## 2019-09-05 LAB — BASIC METABOLIC PANEL
BUN/Creatinine Ratio: 10 (ref 10–24)
BUN: 10 mg/dL (ref 8–27)
CO2: 24 mmol/L (ref 20–29)
Calcium: 9.6 mg/dL (ref 8.6–10.2)
Chloride: 101 mmol/L (ref 96–106)
Creatinine, Ser: 1.05 mg/dL (ref 0.76–1.27)
GFR calc Af Amer: 89 mL/min/{1.73_m2} (ref >59)
GFR calc non Af Amer: 77 mL/min/{1.73_m2} (ref >59)
Glucose: 87 mg/dL (ref 65–99)
Potassium: 3.4 mmol/L — ABNORMAL LOW (ref 3.5–5.2)
Sodium: 141 mmol/L (ref 134–144)

## 2019-09-05 LAB — VITAMIN B12: Vitamin B-12: 291 pg/mL (ref 232–1245)

## 2019-09-05 LAB — HEPATITIS C ANTIBODY: Hep C Virus Ab: <0.1 s/co ratio (ref 0.0–0.9)

## 2019-09-05 LAB — HIV ANTIBODY (ROUTINE TESTING W REFLEX): HIV Screen 4th Generation wRfx: NONREACTIVE

## 2019-09-05 LAB — VITAMIN D 25 HYDROXY (VIT D DEFICIENCY, FRACTURES): Vit D, 25-Hydroxy: 16.6 ng/mL — ABNORMAL LOW (ref 30.0–100.0)

## 2019-09-05 LAB — PSA: Prostate Specific Ag, Serum: 1.9 ng/mL (ref 0.0–4.0)

## 2019-09-05 NOTE — Progress Notes (Signed)
Contacted via MyChart

## 2019-09-06 ENCOUNTER — Telehealth: Payer: Self-pay | Admitting: *Deleted

## 2019-09-06 ENCOUNTER — Encounter: Payer: Self-pay | Admitting: Nurse Practitioner

## 2019-09-06 DIAGNOSIS — E538 Deficiency of other specified B group vitamins: Secondary | ICD-10-CM | POA: Insufficient documentation

## 2019-09-06 NOTE — Telephone Encounter (Signed)
Received referral for low dose lung cancer screening CT scan. Message left at phone number listed in EMR for patient to call me back to facilitate scheduling scan.  

## 2019-09-06 NOTE — Progress Notes (Signed)
Contacted via MyChart

## 2019-09-09 ENCOUNTER — Encounter: Payer: Self-pay | Admitting: *Deleted

## 2019-09-09 ENCOUNTER — Telehealth: Payer: Self-pay | Admitting: *Deleted

## 2019-09-09 NOTE — Telephone Encounter (Signed)
Received referral for low dose lung cancer screening CT scan. Message left at phone number listed in EMR for patient to call me back to facilitate scheduling scan.  

## 2019-10-04 ENCOUNTER — Ambulatory Visit: Payer: BC Managed Care – PPO | Admitting: Nurse Practitioner

## 2019-10-07 ENCOUNTER — Encounter: Payer: Self-pay | Admitting: *Deleted

## 2019-11-01 ENCOUNTER — Telehealth: Payer: Self-pay | Admitting: *Deleted

## 2019-11-01 NOTE — Telephone Encounter (Signed)
Received referral for low dose lung cancer screening CT scan. Message left at phone number listed in EMR for patient to call me back to facilitate scheduling scan.  

## 2019-11-05 ENCOUNTER — Encounter: Payer: Self-pay | Admitting: *Deleted

## 2019-11-28 ENCOUNTER — Other Ambulatory Visit: Payer: Self-pay | Admitting: Nurse Practitioner

## 2020-06-27 DIAGNOSIS — E860 Dehydration: Secondary | ICD-10-CM | POA: Diagnosis not present

## 2020-06-27 DIAGNOSIS — Z79899 Other long term (current) drug therapy: Secondary | ICD-10-CM | POA: Diagnosis not present

## 2020-06-27 DIAGNOSIS — N179 Acute kidney failure, unspecified: Secondary | ICD-10-CM | POA: Diagnosis not present

## 2020-06-27 DIAGNOSIS — M549 Dorsalgia, unspecified: Secondary | ICD-10-CM | POA: Diagnosis not present

## 2020-06-27 DIAGNOSIS — I10 Essential (primary) hypertension: Secondary | ICD-10-CM | POA: Diagnosis not present

## 2020-06-27 DIAGNOSIS — F17210 Nicotine dependence, cigarettes, uncomplicated: Secondary | ICD-10-CM | POA: Diagnosis not present

## 2020-06-27 DIAGNOSIS — R5383 Other fatigue: Secondary | ICD-10-CM | POA: Diagnosis not present

## 2020-06-27 DIAGNOSIS — R6883 Chills (without fever): Secondary | ICD-10-CM | POA: Diagnosis not present

## 2020-06-27 DIAGNOSIS — U071 COVID-19: Secondary | ICD-10-CM | POA: Diagnosis not present

## 2020-10-23 ENCOUNTER — Encounter: Payer: Self-pay | Admitting: Nurse Practitioner

## 2020-10-23 DIAGNOSIS — Z8616 Personal history of COVID-19: Secondary | ICD-10-CM | POA: Insufficient documentation

## 2020-10-28 ENCOUNTER — Ambulatory Visit (INDEPENDENT_AMBULATORY_CARE_PROVIDER_SITE_OTHER): Payer: BC Managed Care – PPO | Admitting: Nurse Practitioner

## 2020-10-28 ENCOUNTER — Encounter: Payer: Self-pay | Admitting: Nurse Practitioner

## 2020-10-28 ENCOUNTER — Other Ambulatory Visit: Payer: Self-pay

## 2020-10-28 VITALS — BP 140/86 | HR 70 | Temp 98.1°F | Wt 146.8 lb

## 2020-10-28 DIAGNOSIS — F17210 Nicotine dependence, cigarettes, uncomplicated: Secondary | ICD-10-CM | POA: Diagnosis not present

## 2020-10-28 DIAGNOSIS — R431 Parosmia: Secondary | ICD-10-CM | POA: Insufficient documentation

## 2020-10-28 DIAGNOSIS — I10 Essential (primary) hypertension: Secondary | ICD-10-CM

## 2020-10-28 DIAGNOSIS — U099 Post covid-19 condition, unspecified: Secondary | ICD-10-CM | POA: Insufficient documentation

## 2020-10-28 MED ORDER — TOPIRAMATE 25 MG PO TABS: 25.0000 mg | ORAL_TABLET | Freq: Every day | ORAL | 4 refills | Status: DC

## 2020-10-28 MED ORDER — LOSARTAN POTASSIUM 25 MG PO TABS: 25.0000 mg | ORAL_TABLET | Freq: Every day | ORAL | 4 refills | Status: DC

## 2020-10-28 MED ORDER — AMLODIPINE BESYLATE 10 MG PO TABS: 10.0000 mg | ORAL_TABLET | Freq: Every day | ORAL | 4 refills | Status: DC

## 2020-10-28 NOTE — Assessment & Plan Note (Signed)
Chronic, ongoing.  Initially elevated with repeat trending down. Home readings at goal.  Recommend he monitor BP at least a few mornings a week at home and document.  DASH diet at home.  Continue current medication regimen and adjust as needed.  Labs today: CMP, CBC, TSH.  Return in 4 weeks.  Refills sent.

## 2020-10-28 NOTE — Assessment & Plan Note (Signed)
Ongoing olfactory and headache issues, refer to parosmia plan of care.

## 2020-10-28 NOTE — Progress Notes (Signed)
BP 140/86   Pulse 70   Temp 98.1 F (36.7 C) (Oral)   Wt 146 lb 12.8 oz (66.6 kg)   SpO2 98%   BMI 21.50 kg/m    Subjective:    Patient ID: Brian Walker, male    DOB: [birth year], 60 y.o.   MRN: [id number]  HPI: Brian Walker is a 60 y.o. male  Chief Complaint  Patient presents with  . Parosmia    Patient states he was hospitalized back between December and January at [facility], and was given a covid test and instantly loss his sense of smell and taste. He states he was diagnosed with "parosmia" says it taste as if he has "leave a deceased rat outside for 3 days and then brought it in the house and then cooking it." Patient states he would like to discuss it with provider possibly treatment options.   . Hypertension    Patient states he has not taken his BP medicine and his bp has been elevated.   HYPERTENSION Has not taken BP medication -- did not take this morning, but is taking regularly.  Is taking daily, Losartan and Amlodipine.  Recent labs in [facility] ER were CRT 1.59, eGFR 46, K+ 3.2 = during Covid with AKI. Hypertension status: stable  Satisfied with current treatment? yes Duration of hypertension: chronic BP monitoring frequency:  weekly BP range: 120/80 range BP medication side effects:  no Medication compliance: good compliance Aspirin: no Recurrent headaches: recently post Covid Visual changes: no Palpitations: no Dyspnea: no Chest pain: no Lower extremity edema: no Dizzy/lightheaded: no   PAROSMIA Has history of Covid 06/27/20, was in [facility] ER for treatment and AKI with multiple fluids given.  While in ER he reports having test for Covid where they did lengthy swab up in far nasal cavity, he reports after this is when the taste buds and sense of smell when away.    Continues to have excessive amount of sneezing with irritation to right nare where they performed swab + has eye twitching.  Recently felt like he was getting sick again and when  resolved he has since had a rotten flesh smell that fluctuates -- worse when blows nose and clears out + if in warm car.  It is so bad it makes him gag when tries to eat.  Present all the time, but worse with certain foods around.  All he has ate in past 3 weeks has been vegetarian soup and crackers.  Reports this is starting to affect him mentally, with nerves.  At home has tried nasal sprays.   Fever: no Cough: no Shortness of breath: yes Wheezing: no Chest pain: no Chest tightness: no Chest congestion: no Nasal congestion: a little bit Runny nose: a little bit Post nasal drip: yes Sneezing: yes Sore throat: no Swollen glands: no Sinus pressure: no Headache: yes -- every day Face pain: no Toothache: no Ear pain: none Ear pressure: none Eyes red/itching:no Eye drainage/crusting: no  Vomiting: no Rash: no Fatigue: no   Relevant past medical, surgical, family and social history reviewed and updated as indicated. Interim medical history since our last visit reviewed. Allergies and medications reviewed and updated.  Review of Systems  Constitutional: Negative for activity change, diaphoresis, fatigue and fever.  HENT: Positive for congestion. Negative for ear discharge, ear pain, nosebleeds and postnasal drip.   Respiratory: Negative for cough, chest tightness, shortness of breath and wheezing.   Cardiovascular: Negative for chest pain, palpitations and leg  swelling.  Gastrointestinal: Negative.   Neurological: Positive for headaches. Negative for dizziness, weakness, light-headedness and numbness.  Psychiatric/Behavioral: Negative.     Per HPI unless specifically indicated above     Objective:    BP 140/86   Pulse 70   Temp 98.1 F (36.7 C) (Oral)   Wt 146 lb 12.8 oz (66.6 kg)   SpO2 98%   BMI 21.50 kg/m   Wt Readings from Last 3 Encounters:  10/28/20 146 lb 12.8 oz (66.6 kg)  09/04/19 158 lb 6.4 oz (71.8 kg)  07/24/19 153 lb (69.4 kg)    Physical Exam Vitals  and nursing note reviewed.  Constitutional:      General: He is awake. He is not in acute distress.    Appearance: He is well-developed and well-groomed. He is not ill-appearing.  HENT:     Head: Normocephalic and atraumatic.     Right Ear: Hearing, tympanic membrane, ear canal and external ear normal. No drainage.     Left Ear: Hearing, tympanic membrane, ear canal and external ear normal. No drainage.     Nose: Rhinorrhea present. Rhinorrhea is clear.     Right Turbinates: Not enlarged.     Left Turbinates: Not enlarged.     Right Sinus: No maxillary sinus tenderness or frontal sinus tenderness.     Left Sinus: No maxillary sinus tenderness or frontal sinus tenderness.     Mouth/Throat:     Lips: No lesions.     Mouth: Mucous membranes are moist.     Pharynx: Oropharynx is clear. Uvula midline. Posterior oropharyngeal erythema (mild cobblestone pattern noted) present.  Eyes:     General: Lids are normal.        Right eye: No discharge.        Left eye: No discharge.     Conjunctiva/sclera: Conjunctivae normal.     Pupils: Pupils are equal, round, and reactive to light.  Neck:     Thyroid: No thyromegaly.     Vascular: No carotid bruit or JVD.     Trachea: Trachea normal.  Cardiovascular:     Rate and Rhythm: Normal rate and regular rhythm.     Heart sounds: Normal heart sounds, S1 normal and S2 normal. No murmur heard. No gallop.   Pulmonary:     Effort: Pulmonary effort is normal.     Breath sounds: Normal breath sounds.  Abdominal:     General: Bowel sounds are normal.     Palpations: Abdomen is soft.  Musculoskeletal:        General: Normal range of motion.     Cervical back: Normal range of motion and neck supple.     Right lower leg: No edema.     Left lower leg: No edema.  Skin:    General: Skin is warm and dry.     Capillary Refill: Capillary refill takes less than 2 seconds.     Findings: No rash.  Neurological:     Mental Status: He is alert and oriented to  person, place, and time.     Deep Tendon Reflexes: Reflexes are normal and symmetric.     Reflex Scores:      Brachioradialis reflexes are 2+ on the right side and 2+ on the left side.      Patellar reflexes are 2+ on the right side and 2+ on the left side. Psychiatric:        Attention and Perception: Attention normal.  Mood and Affect: Mood normal.        Speech: Speech normal.        Behavior: Behavior normal. Behavior is cooperative.        Thought Content: Thought content normal.    Results for orders placed or performed in visit on 51/76/16  Basic Metabolic Panel (BMET)  Result Value Ref Range   Glucose 87 65 - 99 mg/dL   BUN 10 8 - 27 mg/dL   Creatinine, Ser 1.05 0.76 - 1.27 mg/dL   GFR calc non Af Amer 77 >59 mL/min/1.73   GFR calc Af Amer 89 >59 mL/min/1.73   BUN/Creatinine Ratio 10 10 - 24   Sodium 141 134 - 144 mmol/L   Potassium 3.4 (L) 3.5 - 5.2 mmol/L   Chloride 101 96 - 106 mmol/L   CO2 24 20 - 29 mmol/L   Calcium 9.6 8.6 - 10.2 mg/dL  Hepatitis C antibody  Result Value Ref Range   Hep C Virus Ab <0.1 0.0 - 0.9 s/co ratio  VITAMIN D 25 Hydroxy (Vit-D Deficiency, Fractures)  Result Value Ref Range   Vit D, 25-Hydroxy 16.6 (L) 30.0 - 100.0 ng/mL  B12  Result Value Ref Range   Vitamin B-12 291 232 - 1,245 pg/mL  HIV Antibody (routine testing w rflx)  Result Value Ref Range   HIV Screen 4th Generation wRfx Non Reactive Non Reactive  PSA  Result Value Ref Range   Prostate Specific Ag, Serum 1.9 0.0 - 4.0 ng/mL      Assessment & Plan:   Problem List Items Addressed This Visit      Cardiovascular and Mediastinum   Essential hypertension - Primary    Chronic, ongoing.  Initially elevated with repeat trending down. Home readings at goal.  Recommend he monitor BP at least a few mornings a week at home and document.  DASH diet at home.  Continue current medication regimen and adjust as needed.  Labs today: CMP, CBC, TSH.  Return in 4 weeks.  Refills sent.       Relevant Medications   losartan (COZAAR) 25 MG tablet   amLODipine (NORVASC) 10 MG tablet     Nervous and Auditory   Parosmia    Post Covid, ?migraines with olfactory symptoms vs parosmia post Covid.  At this time will trial Topamax 25 MG daily, script sent -- may benefit both headaches and olfactory changes.  Referral to ENT placed for further evaluation.  Return to office in 4 weeks.      Relevant Orders   Comprehensive metabolic panel   TSH   CBC with Differential/Platelet   Ambulatory referral to ENT     Other   Nicotine dependence, cigarettes, uncomplicated    I have recommended complete cessation of tobacco use. I have discussed various options available for assistance with tobacco cessation including over the counter methods (Nicotine gum, patch and lozenges). We also discussed prescription options (Chantix, Nicotine Inhaler / Nasal Spray). The patient is not interested in pursuing any prescription tobacco cessation options at this time. Continue to recommend lung CT screening.       Post-COVID syndrome    Ongoing olfactory and headache issues, refer to parosmia plan of care.        Relevant Orders   Comprehensive metabolic panel   TSH   CBC with Differential/Platelet   Ambulatory referral to ENT       Follow up plan: Return in about 4 weeks (around 11/25/2020) for Physical -- and follow-up.

## 2020-10-28 NOTE — Assessment & Plan Note (Signed)
Post Covid, ?migraines with olfactory symptoms vs parosmia post Covid.  At this time will trial Topamax 25 MG daily, script sent -- may benefit both headaches and olfactory changes.  Referral to ENT placed for further evaluation.  Return to office in 4 weeks.

## 2020-10-28 NOTE — Assessment & Plan Note (Signed)
I have recommended complete cessation of tobacco use. I have discussed various options available for assistance with tobacco cessation including over the counter methods (Nicotine gum, patch and lozenges). We also discussed prescription options (Chantix, Nicotine Inhaler / Nasal Spray). The patient is not interested in pursuing any prescription tobacco cessation options at this time. Continue to recommend lung CT screening.  

## 2020-10-28 NOTE — Patient Instructions (Signed)

## 2020-10-29 ENCOUNTER — Other Ambulatory Visit: Payer: Self-pay | Admitting: Nurse Practitioner

## 2020-10-29 DIAGNOSIS — E876 Hypokalemia: Secondary | ICD-10-CM

## 2020-10-29 LAB — CBC WITH DIFFERENTIAL/PLATELET
Basophils Absolute: 0.1 10*3/uL (ref 0.0–0.2)
Basos: 1 %
EOS (ABSOLUTE): 0.1 10*3/uL (ref 0.0–0.4)
Eos: 1 %
Hematocrit: 49 % (ref 37.5–51.0)
Hemoglobin: 16.4 g/dL (ref 13.0–17.7)
Immature Grans (Abs): 0 10*3/uL (ref 0.0–0.1)
Immature Granulocytes: 0 %
Lymphocytes Absolute: 3.1 10*3/uL (ref 0.7–3.1)
Lymphs: 44 %
MCH: 30.4 pg (ref 26.6–33.0)
MCHC: 33.5 g/dL (ref 31.5–35.7)
MCV: 91 fL (ref 79–97)
Monocytes Absolute: 0.5 10*3/uL (ref 0.1–0.9)
Monocytes: 7 %
Neutrophils Absolute: 3.4 10*3/uL (ref 1.4–7.0)
Neutrophils: 47 %
Platelets: 283 10*3/uL (ref 150–450)
RBC: 5.39 x10E6/uL (ref 4.14–5.80)
RDW: 13.7 % (ref 11.6–15.4)
WBC: 7.2 10*3/uL (ref 3.4–10.8)

## 2020-10-29 LAB — COMPREHENSIVE METABOLIC PANEL WITH GFR
ALT: 11 IU/L (ref 0–44)
AST: 19 IU/L (ref 0–40)
Albumin/Globulin Ratio: 1.3 (ref 1.2–2.2)
Albumin: 4 g/dL (ref 3.8–4.8)
Alkaline Phosphatase: 92 IU/L (ref 44–121)
BUN/Creatinine Ratio: 8 — ABNORMAL LOW (ref 10–24)
BUN: 9 mg/dL (ref 8–27)
Bilirubin Total: 0.5 mg/dL (ref 0.0–1.2)
CO2: 25 mmol/L (ref 20–29)
Calcium: 9.6 mg/dL (ref 8.6–10.2)
Chloride: 97 mmol/L (ref 96–106)
Creatinine, Ser: 1.13 mg/dL (ref 0.76–1.27)
Globulin, Total: 3.1 g/dL (ref 1.5–4.5)
Glucose: 82 mg/dL (ref 65–99)
Potassium: 3.3 mmol/L — ABNORMAL LOW (ref 3.5–5.2)
Sodium: 141 mmol/L (ref 134–144)
Total Protein: 7.1 g/dL (ref 6.0–8.5)
eGFR: 74 mL/min/1.73

## 2020-10-29 LAB — TSH: TSH: 0.732 u[IU]/mL (ref 0.450–4.500)

## 2020-10-29 MED ORDER — POTASSIUM CHLORIDE ER 10 MEQ PO TBCR: 10.0000 meq | EXTENDED_RELEASE_TABLET | Freq: Every day | ORAL | 0 refills | Status: DC

## 2020-10-29 NOTE — Progress Notes (Signed)
Contacted via MyChart   Good afternoon Brian Walker, your labs have returned.  Overall they are stable with exception of potassium level being a little low.  I am going to send in a low dose of potassium for you to take for the next 5 days.  I would like you to schedule a lab visit only for next week to recheck this level.  Suspect it is related to your current diet and not getting enough potassium in.  Any questions? Please schedule lab visit for next week. Keep being awesome!!  Thank you for allowing me to participate in your care. Kindest regards, Lalani Winkles

## 2020-11-10 DIAGNOSIS — R438 Other disturbances of smell and taste: Secondary | ICD-10-CM | POA: Diagnosis not present

## 2020-11-10 DIAGNOSIS — J342 Deviated nasal septum: Secondary | ICD-10-CM | POA: Diagnosis not present

## 2020-11-10 DIAGNOSIS — J301 Allergic rhinitis due to pollen: Secondary | ICD-10-CM | POA: Diagnosis not present

## 2022-07-23 DIAGNOSIS — J101 Influenza due to other identified influenza virus with other respiratory manifestations: Secondary | ICD-10-CM | POA: Diagnosis not present

## 2022-07-23 DIAGNOSIS — R9431 Abnormal electrocardiogram [ECG] [EKG]: Secondary | ICD-10-CM | POA: Diagnosis not present

## 2022-07-23 DIAGNOSIS — Z885 Allergy status to narcotic agent status: Secondary | ICD-10-CM | POA: Diagnosis not present

## 2022-07-23 DIAGNOSIS — Z1152 Encounter for screening for COVID-19: Secondary | ICD-10-CM | POA: Diagnosis not present

## 2022-07-23 DIAGNOSIS — I10 Essential (primary) hypertension: Secondary | ICD-10-CM | POA: Diagnosis not present

## 2022-07-23 DIAGNOSIS — R112 Nausea with vomiting, unspecified: Secondary | ICD-10-CM | POA: Diagnosis not present

## 2022-07-23 DIAGNOSIS — R059 Cough, unspecified: Secondary | ICD-10-CM | POA: Diagnosis not present

## 2022-07-23 DIAGNOSIS — R1110 Vomiting, unspecified: Secondary | ICD-10-CM | POA: Diagnosis not present

## 2022-07-23 DIAGNOSIS — R509 Fever, unspecified: Secondary | ICD-10-CM | POA: Diagnosis not present

## 2022-07-23 DIAGNOSIS — Z880 Allergy status to penicillin: Secondary | ICD-10-CM | POA: Diagnosis not present

## 2022-07-24 DIAGNOSIS — R1110 Vomiting, unspecified: Secondary | ICD-10-CM | POA: Diagnosis not present

## 2022-07-24 DIAGNOSIS — J101 Influenza due to other identified influenza virus with other respiratory manifestations: Secondary | ICD-10-CM | POA: Diagnosis not present

## 2022-07-30 DIAGNOSIS — N390 Urinary tract infection, site not specified: Secondary | ICD-10-CM | POA: Diagnosis not present

## 2022-07-30 DIAGNOSIS — J22 Unspecified acute lower respiratory infection: Secondary | ICD-10-CM | POA: Diagnosis not present

## 2022-07-30 DIAGNOSIS — R300 Dysuria: Secondary | ICD-10-CM | POA: Diagnosis not present

## 2022-09-13 ENCOUNTER — Ambulatory Visit: Payer: Self-pay

## 2022-09-13 ENCOUNTER — Ambulatory Visit (INDEPENDENT_AMBULATORY_CARE_PROVIDER_SITE_OTHER): Payer: BC Managed Care – PPO | Admitting: Nurse Practitioner

## 2022-09-13 ENCOUNTER — Encounter: Payer: Self-pay | Admitting: Nurse Practitioner

## 2022-09-13 VITALS — BP 180/98 | HR 80 | Temp 99.0°F | Wt 147.6 lb

## 2022-09-13 DIAGNOSIS — H6690 Otitis media, unspecified, unspecified ear: Secondary | ICD-10-CM

## 2022-09-13 MED ORDER — DOXYCYCLINE HYCLATE 100 MG PO TABS: 100.0000 mg | ORAL_TABLET | Freq: Two times a day (BID) | ORAL | 0 refills | Status: DC

## 2022-09-13 NOTE — Telephone Encounter (Signed)
  Chief Complaint: partial hearing loss to right ear Symptoms: severe pain and pressure, ringing in ear  Frequency: yesterday Pertinent Negatives: Patient denies dizziness Disposition: []$ ED /[]$ Urgent Care (no appt availability in office) / [x]$ Appointment(In office/virtual)/ []$  [facility]/ []$ Home Care/ []$ Refused Recommended Disposition /[]$ [facility]/ []$  Follow-up with PCP Additional Notes: called office and Alwyn Ren was able to work pt in for 1300 appt. Advised pt to arrive 10 minutes early per [facility] and to come to the new address [address]. Pt repeated address.  Reason for Disposition  Earache  Answer Assessment - Initial Assessment Questions 1. DESCRIPTION: "What type of hearing problem are you having? Describe it for me." (e.g., complete hearing loss, partial loss)     Partial loss  2. LOCATION: "One or both ears?" If one, ask: "Which ear?"     right ear  3. SEVERITY: "Can you hear anything?" If Yes, ask: "What can you hear?" (e.g., ticking watch, whisper, talking)   - MILD:  Difficulty hearing soft speech, quiet library sounds, or speech from a distance or over background noise.   - MODERATE: Difficulty hearing normal speech even at closed distances.   - SEVERE: Unable to hear most normal conversation and talking; only able to hear loud sounds such as an alarm clock.     moderate 4. ONSET: "When did this begin?" "Did it start suddenly or come on gradually?"     Yesterday  5. PATTERN: "Does this come and go, or has it been constant since it started?"     constant 6. PAIN: "Is there any pain in your ear(s)?"  (Scale 1-10; or mild, moderate, severe)   - NONE (0): no pain   - MILD (1-3): doesn't interfere with normal activities    - MODERATE (4-7): interferes with normal activities or awakens from sleep    - SEVERE (8-10): excruciating pain, unable to do any normal activities      severe 7. CAUSE: "What do you think is causing this hearing  problem?"     *No Answer* 8. OTHER SYMPTOMS: "Do you have any other symptoms?" (e.g., dizziness, ringing in ears)     Neck spasms over the weekend no wbetter  9. PREGNANCY: "Is there any chance you are pregnant?" "When was your last menstrual period?"     N/a  Protocols used: Hearing Loss or Change-A-AH

## 2022-09-13 NOTE — Progress Notes (Signed)
BP (!) 180/98   Pulse 80   Temp 99 F (37.2 C) (Oral)   Wt 147 lb 9.6 oz (67 kg)   SpO2 97%   BMI 21.61 kg/m    Subjective:    Patient ID: Brian Walker, male    DOB: Dec 11, 1960, 62 y.o.   MRN: [id number]  HPI: Brian Walker is a 62 y.o. male  No chief complaint on file.  EAR PAIN Patient states he has had a cold for 1-2 weeks that has been causing drainage.   Duration: weeks Involved ear(s): bilateral Severity:  mild  Quality:  aching Fever: no Otorrhea: no Upper respiratory infection symptoms: yes Pruritus: no Hearing loss: yes Water immersion no Using Q-tips: no Recurrent otitis media: no Status: stable Treatments attempted: none  Relevant past medical, surgical, family and social history reviewed and updated as indicated. Interim medical history since our last visit reviewed. Allergies and medications reviewed and updated.  Review of Systems  HENT:  Positive for congestion, ear pain and postnasal drip. Negative for rhinorrhea, sinus pressure, sinus pain, sneezing and sore throat.   Respiratory:  Positive for cough. Negative for chest tightness, shortness of breath and wheezing.   Gastrointestinal:  Negative for vomiting.  Skin:  Negative for rash.  Neurological:  Negative for headaches.    Per HPI unless specifically indicated above     Objective:    BP (!) 180/98   Pulse 80   Temp 99 F (37.2 C) (Oral)   Wt 147 lb 9.6 oz (67 kg)   SpO2 97%   BMI 21.61 kg/m   Wt Readings from Last 3 Encounters:  09/13/22 147 lb 9.6 oz (67 kg)  10/28/20 146 lb 12.8 oz (66.6 kg)  09/04/19 158 lb 6.4 oz (71.8 kg)    Physical Exam Vitals and nursing note reviewed.  Constitutional:      General: He is not in acute distress.    Appearance: Normal appearance. He is not ill-appearing, toxic-appearing or diaphoretic.  HENT:     Head: Normocephalic.     Right Ear: Tympanic membrane and external ear normal.     Left Ear: External ear normal.     Nose:  Congestion and rhinorrhea present.     Mouth/Throat:     Mouth: Mucous membranes are moist.     Pharynx: Posterior oropharyngeal erythema present.  Eyes:     General:        Right eye: No discharge.        Left eye: No discharge.     Extraocular Movements: Extraocular movements intact.     Conjunctiva/sclera: Conjunctivae normal.     Pupils: Pupils are equal, round, and reactive to light.  Cardiovascular:     Rate and Rhythm: Normal rate and regular rhythm.     Heart sounds: No murmur heard. Pulmonary:     Effort: Pulmonary effort is normal. No respiratory distress.     Breath sounds: Normal breath sounds. No wheezing, rhonchi or rales.  Abdominal:     General: Abdomen is flat. Bowel sounds are normal.  Musculoskeletal:     Cervical back: Normal range of motion and neck supple.  Skin:    General: Skin is warm and dry.     Capillary Refill: Capillary refill takes less than 2 seconds.  Neurological:     General: No focal deficit present.     Mental Status: He is alert and oriented to person, place, and time.  Psychiatric:  Mood and Affect: Mood normal.        Behavior: Behavior normal.        Thought Content: Thought content normal.        Judgment: Judgment normal.     Results for orders placed or performed in visit on 10/28/20  Comprehensive metabolic panel  Result Value Ref Range   Glucose 82 65 - 99 mg/dL   BUN 9 8 - 27 mg/dL   Creatinine, Ser 1.13 0.76 - 1.27 mg/dL   eGFR 74 >59 mL/min/1.73   BUN/Creatinine Ratio 8 (L) 10 - 24   Sodium 141 134 - 144 mmol/L   Potassium 3.3 (L) 3.5 - 5.2 mmol/L   Chloride 97 96 - 106 mmol/L   CO2 25 20 - 29 mmol/L   Calcium 9.6 8.6 - 10.2 mg/dL   Total Protein 7.1 6.0 - 8.5 g/dL   Albumin 4.0 3.8 - 4.8 g/dL   Globulin, Total 3.1 1.5 - 4.5 g/dL   Albumin/Globulin Ratio 1.3 1.2 - 2.2   Bilirubin Total 0.5 0.0 - 1.2 mg/dL   Alkaline Phosphatase 92 44 - 121 IU/L   AST 19 0 - 40 IU/L   ALT 11 0 - 44 IU/L  TSH  Result Value  Ref Range   TSH 0.732 0.450 - 4.500 uIU/mL  CBC with Differential/Platelet  Result Value Ref Range   WBC 7.2 3.4 - 10.8 x10E3/uL   RBC 5.39 4.14 - 5.80 x10E6/uL   Hemoglobin 16.4 13.0 - 17.7 g/dL   Hematocrit 49.0 37.5 - 51.0 %   MCV 91 79 - 97 fL   MCH 30.4 26.6 - 33.0 pg   MCHC 33.5 31.5 - 35.7 g/dL   RDW 13.7 11.6 - 15.4 %   Platelets 283 150 - 450 x10E3/uL   Neutrophils 47 Not Estab. %   Lymphs 44 Not Estab. %   Monocytes 7 Not Estab. %   Eos 1 Not Estab. %   Basos 1 Not Estab. %   Neutrophils Absolute 3.4 1.4 - 7.0 x10E3/uL   Lymphocytes Absolute 3.1 0.7 - 3.1 x10E3/uL   Monocytes Absolute 0.5 0.1 - 0.9 x10E3/uL   EOS (ABSOLUTE) 0.1 0.0 - 0.4 x10E3/uL   Basophils Absolute 0.1 0.0 - 0.2 x10E3/uL   Immature Granulocytes 0 Not Estab. %   Immature Grans (Abs) 0.0 0.0 - 0.1 x10E3/uL      Assessment & Plan:   Problem List Items Addressed This Visit   None Visit Diagnoses     Acute otitis media, unspecified otitis media type    -  Primary   Will treat with doxycyline.  Complete course of antibiotics.  Follow up if symptoms do not improve. Return in 2 weeks for reevaluation.   Relevant Medications   doxycycline (VIBRA-TABS) 100 MG tablet        Follow up plan: Return in 2 weeks (on 09/27/2022), or if symptoms worsen or fail to improve, for Routine Follow up with PCP.

## 2022-09-14 ENCOUNTER — Ambulatory Visit: Payer: Self-pay | Admitting: *Deleted

## 2022-09-14 NOTE — Telephone Encounter (Signed)
  Chief Complaint: Having a lot of drainage from right ear.  Seen yesterday by Brian Walker for an ear infection in his right ear.   Doxycycline started yesterday Symptoms: A lot of clear drainage from right ear which when dry is orange colored.   It's draining about a drop every 5 seconds today.    Pressure and pain is much better today.   He still can't hear out of that ear. Frequency: Since yesterday about 1:30 PM.   (After getting home from his appt). Pertinent Negatives: Patient denies Fever or bleeding. Disposition: []$ ED /[]$ Urgent Care (no appt availability in office) / []$ Appointment(In office/virtual)/ []$  [facility]/ []$ Home Care/ []$ Refused Recommended Disposition /[]$ [facility]/ [x]$  Follow-up with PCP Additional Notes: Message sent to [employer].   Pt. Agreeable to someone calling him back.

## 2022-09-14 NOTE — Telephone Encounter (Signed)
Message from [place] sent at 09/14/2022  1:59 PM EST  Summary: concerns of discharge coming from ear   Pt had an appt yesterday and was prescribed doxycycline (VIBRA-TABS) 100 MG tablet. Pt reports that his ear has an orange like color discharge and he would like a nurse to call him back to discuss. Cb# [id number]          Call History   Type Contact Phone/Fax User  09/14/2022 01:56 PM EST Phone (Incoming) Brian Walker, Brian Walker (Self) [phone] Carmina Miller, Jacinto Reap   Reason for Disposition  [1] Clear drainage (not from a head injury) AND [2] persists > 24 hours    Clear drainage coming from right ear but once it's dry it's orange.  Answer Assessment - Initial Assessment Questions 1. LOCATION: "Which ear is involved?"      I had an appt. Yesterday for the ear pain with Jon Billings.     Last 2 weeks my right  ear was stopping up.  Sunday night during the night it shut down completely.   I could hear myself talking from inside before that but     then I could not hear anything from my ear. No discharge yesterday until I got home from my appt.  I was told you have an ear infection in right ear.   Left ear ok.  Doxycycline started. It may discharge I was told.   But this is draining a lot.  2. COLOR: "What is the color of the discharge?"      I just got home from the appt. Yesterday and I looked down to the right to untie my shoe and it drained a lot.   It popped and started draining.    More than 1/2 of a shot glass full of discharge came out.   It was slimy and sticky.    I laid on right side so it could drain for a while after that.   This happened about 1:30 yesterday.    It's still draining.    I'm losing a drop about every 5 seconds.   My throat is soaked on the outside from the drainage.     I was told it would possibly drain but this is a lot.   Is this normal?   3. CONSISTENCY: "How runny is the discharge? Could it be water?"      Drainage is orange when it's dry.   It's  clear coming out of my ear. 4. ONSET: "When did you first notice the discharge?"     Yesterday afternoon when I got home from my appt. About 1:30 PM.  It's still stopped up and I can't hear but the pain and pressure is feeling better.   I'm feeling better today.    My pillow was soaked this morning.   I had a dish towel under my head and it was soaked too this morning.   My ear is popping about every 10 minutes. 5. PAIN: "Is there any earache?" "How bad is it?"  (Scale 1-10; or mild, moderate, severe)     I had a bad crick from Friday until late yesterday.   I had a crick in my neck and pain down my shoulder which has cleared up almost completely since my ear started draining.   6. OBJECTS: "Have you put anything in your ear?" (e.g., Q-tip, other object)      No    Just letting it drain 7. OTHER SYMPTOMS: "Do you have any  other symptoms?" (e.g., headache, fever, dizziness, vomiting, runny nose)     Ear is popping about every 10 minutes and it's still draining about a drop every 5 seconds if I had to guess the amount. 8. PREGNANCY: "Is there any chance you are pregnant?" "When was your last menstrual period?"     N/A  Protocols used: Ear - Discharge-A-AH

## 2022-09-14 NOTE — Telephone Encounter (Signed)
Patient needs to come back in for an appt.

## 2022-09-15 ENCOUNTER — Ambulatory Visit (INDEPENDENT_AMBULATORY_CARE_PROVIDER_SITE_OTHER): Payer: BC Managed Care – PPO | Admitting: Physician Assistant

## 2022-09-15 ENCOUNTER — Encounter: Payer: Self-pay | Admitting: Physician Assistant

## 2022-09-15 VITALS — BP 154/104 | HR 77 | Temp 98.5°F | Wt 144.7 lb

## 2022-09-15 DIAGNOSIS — H7291 Unspecified perforation of tympanic membrane, right ear: Secondary | ICD-10-CM | POA: Diagnosis not present

## 2022-09-15 DIAGNOSIS — H6691 Otitis media, unspecified, right ear: Secondary | ICD-10-CM

## 2022-09-15 MED ORDER — CIPROFLOXACIN-DEXAMETHASONE 0.3-0.1 % OT SUSP: 4.0000 [drp] | Freq: Two times a day (BID) | OTIC | 0 refills | Status: AC

## 2022-09-15 NOTE — Progress Notes (Signed)
Acute Office Visit   Patient: Brian Walker   DOB: 1961/03/22   62 y.o. Male  MRN: [id number] Visit Date: 09/15/2022  Today's healthcare provider: Dani Gobble Reniah Cottingham, PA-C  Introduced myself to the patient as a Journalist, newspaper and provided education on APPs in clinical practice.    Chief Complaint  Patient presents with   Ear Problem    Patient says he has a severely infected R ear. Patient says he was seen Tuesday for the same issue. Patient says he says he was asked if he had ear discharge and he says after his appointment, he noticed drainage from his ear and became dizzy. Patient says it was like an orange slimy discharge and says it drain for about 2 days.    Subjective    HPI HPI     Ear Problem    Additional comments: Patient says he has a severely infected R ear. Patient says he was seen Tuesday for the same issue. Patient says he says he was asked if he had ear discharge and he says after his appointment, he noticed drainage from his ear and became dizzy. Patient says it was like an orange slimy discharge and says it drain for about 2 days.       Last edited by Irena Reichmann, [place] on 09/15/2022 10:37 AM.       Reports ear feeling like it was "opening and closing"  States there was severe pain in right ear and noticed he was having trouble with balance sometime next week  States he has been trying to protect ears with ear buds but pain was too much  He was seen on 09/13/22 and diagnosed with severe ear infection He states he felt a pop on the right ear and he started having drainage from right ear- states it was slick and sticky, seemed to be orange in color  Reports he has constant drainage all night Tuesday and Wed - states he has had hearing loss in the right ear for the past 3 days   He has been taking the Doxycycline as directed       Medications: Outpatient Medications Prior to Visit  Medication Sig   albuterol (VENTOLIN HFA) 108 (90 Base) MCG/ACT inhaler Inhale  2 puffs into the lungs every 6 (six) hours as needed for wheezing or shortness of breath.   amLODipine (NORVASC) 10 MG tablet Take 1 tablet (10 mg total) by mouth daily.   doxycycline (VIBRA-TABS) 100 MG tablet Take 1 tablet (100 mg total) by mouth 2 (two) times daily.   losartan (COZAAR) 25 MG tablet Take 1 tablet (25 mg total) by mouth daily.   potassium chloride (KLOR-CON) 10 MEQ tablet Take 1 tablet (10 mEq total) by mouth daily for 5 days.   topiramate (TOPAMAX) 25 MG tablet Take 1 tablet (25 mg total) by mouth daily. (Patient not taking: Reported on 09/13/2022)   No facility-administered medications prior to visit.    Review of Systems  Constitutional:  Negative for chills and fever.  HENT:  Positive for ear discharge and ear pain.        Objective    BP (!) 154/104   Pulse 77   Temp 98.5 F (36.9 C)   Wt 144 lb 11.2 oz (65.6 kg)   SpO2 98%   BMI 21.19 kg/m    Physical Exam Vitals reviewed.  Constitutional:      Appearance: Normal appearance.  HENT:  Head: Normocephalic and atraumatic.     Right Ear: Hearing normal. Drainage present. There is no impacted cerumen. Tympanic membrane is perforated.     Left Ear: Hearing, tympanic membrane and ear canal normal.  Eyes:     General: Lids are normal. Gaze aligned appropriately.     Extraocular Movements: Extraocular movements intact.     Conjunctiva/sclera: Conjunctivae normal.  Pulmonary:     Effort: Pulmonary effort is normal.  Musculoskeletal:     Cervical back: Normal range of motion.  Neurological:     Mental Status: He is alert.       No results found for any visits on 09/15/22.  Assessment & Plan      No follow-ups on file.     Problem List Items Addressed This Visit   None Visit Diagnoses     Acute otitis media of right ear with perforation    -  Primary Acute, ongoing concern Reports the afternoon he was seen and prescribed Doxycycline for acute otitis media he noticed a popping sensation and  his right ear started draining copiously  PE demonstrates discharge and mild irritation in right ear canal along with missing TM - suspect perforation from otitis media Recommend he continue with oral Doxycycline and will add Ciprodex otic suspension for further coverage  Reviewed that TM repair can take some time and he should avoid swimming and getting water into ear while bathing Follow up as needed for hearing changes or continued ear pain, drainage.     Relevant Medications   ciprofloxacin-dexamethasone (CIPRODEX) OTIC suspension        No follow-ups on file.   I, Blaike Vickers E Marri Mcneff, PA-C, have reviewed all documentation for this visit. The documentation on 09/15/22 for the exam, diagnosis, procedures, and orders are all accurate and complete.   Talitha Givens, MHS, PA-C [facility]

## 2022-09-27 ENCOUNTER — Ambulatory Visit: Payer: BC Managed Care – PPO | Admitting: Nurse Practitioner

## 2022-09-27 DIAGNOSIS — F17210 Nicotine dependence, cigarettes, uncomplicated: Secondary | ICD-10-CM

## 2022-09-27 DIAGNOSIS — E559 Vitamin D deficiency, unspecified: Secondary | ICD-10-CM

## 2022-09-27 DIAGNOSIS — E538 Deficiency of other specified B group vitamins: Secondary | ICD-10-CM

## 2022-09-27 DIAGNOSIS — E7800 Pure hypercholesterolemia, unspecified: Secondary | ICD-10-CM

## 2022-09-27 DIAGNOSIS — I10 Essential (primary) hypertension: Secondary | ICD-10-CM

## 2022-09-27 DIAGNOSIS — N400 Enlarged prostate without lower urinary tract symptoms: Secondary | ICD-10-CM

## 2022-10-01 NOTE — Patient Instructions (Signed)

## 2022-10-03 ENCOUNTER — Ambulatory Visit (INDEPENDENT_AMBULATORY_CARE_PROVIDER_SITE_OTHER): Payer: BC Managed Care – PPO | Admitting: Nurse Practitioner

## 2022-10-03 ENCOUNTER — Encounter: Payer: Self-pay | Admitting: Nurse Practitioner

## 2022-10-03 VITALS — BP 142/90 | HR 82 | Temp 98.0°F | Ht 69.29 in | Wt 141.0 lb

## 2022-10-03 DIAGNOSIS — E7800 Pure hypercholesterolemia, unspecified: Secondary | ICD-10-CM | POA: Diagnosis not present

## 2022-10-03 DIAGNOSIS — E538 Deficiency of other specified B group vitamins: Secondary | ICD-10-CM | POA: Diagnosis not present

## 2022-10-03 DIAGNOSIS — H6501 Acute serous otitis media, right ear: Secondary | ICD-10-CM | POA: Insufficient documentation

## 2022-10-03 DIAGNOSIS — I10 Essential (primary) hypertension: Secondary | ICD-10-CM | POA: Diagnosis not present

## 2022-10-03 DIAGNOSIS — E559 Vitamin D deficiency, unspecified: Secondary | ICD-10-CM

## 2022-10-03 DIAGNOSIS — N400 Enlarged prostate without lower urinary tract symptoms: Secondary | ICD-10-CM

## 2022-10-03 DIAGNOSIS — F17210 Nicotine dependence, cigarettes, uncomplicated: Secondary | ICD-10-CM

## 2022-10-03 MED ORDER — AMLODIPINE BESYLATE 10 MG PO TABS: 10.0000 mg | ORAL_TABLET | Freq: Every day | ORAL | 5 refills | Status: DC

## 2022-10-03 MED ORDER — LOSARTAN POTASSIUM 50 MG PO TABS: 50.0000 mg | ORAL_TABLET | Freq: Every day | ORAL | 4 refills | Status: DC

## 2022-10-03 MED ORDER — LEVOFLOXACIN 500 MG PO TABS: 500.0000 mg | ORAL_TABLET | Freq: Every day | ORAL | 0 refills | Status: AC

## 2022-10-03 NOTE — Progress Notes (Signed)
BP (!) 142/90 (BP Location: Left Arm, Patient Position: Sitting, Cuff Size: Normal)   Pulse 82   Temp 98 F (36.7 C) (Oral)   Ht 5' 9.29" (1.76 m)   Wt 141 lb (64 kg)   SpO2 97%   BMI 20.65 kg/m    Subjective:    Patient ID: Brian Walker, male    DOB: 06-Aug-1960, 62 y.o.   MRN: [id number]  HPI: Brian Walker is a 62 y.o. male  Chief Complaint  Patient presents with   Hypertension    Has been taking losartan for 3 weeks straight   Benign Prostatic Hypertrophy   Elevated LDL   B-12 Deficiency   Otitis Media    Here for follow up, states he is still having discomfort   HYPERTENSION / HYPERLIPIDEMIA Lost to follow-up with last PCP visit 10/28/20.  At that time was on Losartan and Amlodipine for HTN + taking B12 for low levels.  He reports increased stressors due to not being able to going to work and loss of income with this.  He is currently taking his Losartan and Potassium.  Not currently taking B12 supplement at home.  Continues to smoke daily. Satisfied with current treatment? yes Duration of hypertension: chronic BP monitoring frequency: not checking BP range:  BP medication side effects: no Duration of hyperlipidemia: chronic Aspirin: no Recent stressors: no Recurrent headaches: no Visual changes: no Palpitations: no Dyspnea: no Chest pain: no Lower extremity edema: no Dizzy/lightheaded: no  The ASCVD Risk score (Arnett DK, et al., 2019) failed to calculate for the following reasons:   Cannot find a previous HDL lab   Cannot find a previous total cholesterol lab  OTITIS MEDIA Treated on 09/13/22 with Doxycycline and then recheck on 09/15/22 added drops. Has ongoing issues with both ears -- did have ruptured ear drum right side and he reports some drainage now from left side.   Duration: weeks Involved ear(s): bilateral Severity:  4/10  Quality:  dull and aching Fever: no Otorrhea: yes Upper respiratory infection symptoms: no Pruritus: no Hearing  loss: yes Water immersion no Using Q-tips: no Recurrent otitis media: no Status: fluctuating Treatments attempted:  Doxycycline and ear drops    Relevant past medical, surgical, family and social history reviewed and updated as indicated. Interim medical history since our last visit reviewed. Allergies and medications reviewed and updated.  Review of Systems  Constitutional:  Negative for activity change, diaphoresis, fatigue and fever.  HENT:  Positive for congestion, ear pain and hearing loss. Negative for ear discharge, nosebleeds, postnasal drip, sinus pressure and sinus pain.   Respiratory:  Negative for cough, chest tightness, shortness of breath and wheezing.   Cardiovascular:  Negative for chest pain, palpitations and leg swelling.  Gastrointestinal: Negative.   Neurological: Negative.   Psychiatric/Behavioral: Negative.     Per HPI unless specifically indicated above     Objective:    BP (!) 142/90 (BP Location: Left Arm, Patient Position: Sitting, Cuff Size: Normal)   Pulse 82   Temp 98 F (36.7 C) (Oral)   Ht 5' 9.29" (1.76 m)   Wt 141 lb (64 kg)   SpO2 97%   BMI 20.65 kg/m   Wt Readings from Last 3 Encounters:  10/03/22 141 lb (64 kg)  09/15/22 144 lb 11.2 oz (65.6 kg)  09/13/22 147 lb 9.6 oz (67 kg)    Physical Exam Vitals and nursing note reviewed.  Constitutional:      General: He is awake.  He is not in acute distress.    Appearance: He is well-developed and well-groomed. He is not ill-appearing.  HENT:     Head: Normocephalic and atraumatic.     Right Ear: Hearing, ear canal and external ear normal. No drainage. There is no impacted cerumen. Tympanic membrane is injected (mild) and perforated.     Left Ear: Hearing, ear canal and external ear normal. No drainage. A middle ear effusion is present. There is no impacted cerumen. Tympanic membrane is not injected or perforated.     Nose: Rhinorrhea present. Rhinorrhea is clear.     Right Turbinates: Not  enlarged.     Left Turbinates: Not enlarged.     Right Sinus: No maxillary sinus tenderness or frontal sinus tenderness.     Left Sinus: No maxillary sinus tenderness or frontal sinus tenderness.     Mouth/Throat:     Lips: No lesions.     Mouth: Mucous membranes are moist.     Dentition: Abnormal dentition (poor dentition).     Pharynx: Oropharynx is clear. Uvula midline. Posterior oropharyngeal erythema (mild cobblestone pattern noted) present.  Eyes:     General: Lids are normal.        Right eye: No discharge.        Left eye: No discharge.     Conjunctiva/sclera: Conjunctivae normal.     Pupils: Pupils are equal, round, and reactive to light.  Neck:     Thyroid: No thyromegaly.     Vascular: No carotid bruit or JVD.     Trachea: Trachea normal.  Cardiovascular:     Rate and Rhythm: Normal rate and regular rhythm.     Heart sounds: Normal heart sounds, S1 normal and S2 normal. No murmur heard.    No gallop.  Pulmonary:     Effort: Pulmonary effort is normal.     Breath sounds: Normal breath sounds.  Abdominal:     General: Bowel sounds are normal.     Palpations: Abdomen is soft.  Musculoskeletal:        General: Normal range of motion.     Cervical back: Normal range of motion and neck supple.     Right lower leg: No edema.     Left lower leg: No edema.  Skin:    General: Skin is warm and dry.     Capillary Refill: Capillary refill takes less than 2 seconds.     Findings: No rash.  Neurological:     Mental Status: He is alert and oriented to person, place, and time.     Deep Tendon Reflexes: Reflexes are normal and symmetric.     Reflex Scores:      Brachioradialis reflexes are 2+ on the right side and 2+ on the left side.      Patellar reflexes are 2+ on the right side and 2+ on the left side. Psychiatric:        Attention and Perception: Attention normal.        Mood and Affect: Mood normal.        Speech: Speech normal.        Behavior: Behavior normal.  Behavior is cooperative.        Thought Content: Thought content normal.    Results for orders placed or performed in visit on 10/28/20  Comprehensive metabolic panel  Result Value Ref Range   Glucose 82 65 - 99 mg/dL   BUN 9 8 - 27 mg/dL   Creatinine, Ser 1.13 0.76 -  1.27 mg/dL   eGFR 74 >59 mL/min/1.73   BUN/Creatinine Ratio 8 (L) 10 - 24   Sodium 141 134 - 144 mmol/L   Potassium 3.3 (L) 3.5 - 5.2 mmol/L   Chloride 97 96 - 106 mmol/L   CO2 25 20 - 29 mmol/L   Calcium 9.6 8.6 - 10.2 mg/dL   Total Protein 7.1 6.0 - 8.5 g/dL   Albumin 4.0 3.8 - 4.8 g/dL   Globulin, Total 3.1 1.5 - 4.5 g/dL   Albumin/Globulin Ratio 1.3 1.2 - 2.2   Bilirubin Total 0.5 0.0 - 1.2 mg/dL   Alkaline Phosphatase 92 44 - 121 IU/L   AST 19 0 - 40 IU/L   ALT 11 0 - 44 IU/L  TSH  Result Value Ref Range   TSH 0.732 0.450 - 4.500 uIU/mL  CBC with Differential/Platelet  Result Value Ref Range   WBC 7.2 3.4 - 10.8 x10E3/uL   RBC 5.39 4.14 - 5.80 x10E6/uL   Hemoglobin 16.4 13.0 - 17.7 g/dL   Hematocrit 49.0 37.5 - 51.0 %   MCV 91 79 - 97 fL   MCH 30.4 26.6 - 33.0 pg   MCHC 33.5 31.5 - 35.7 g/dL   RDW 13.7 11.6 - 15.4 %   Platelets 283 150 - 450 x10E3/uL   Neutrophils 47 Not Estab. %   Lymphs 44 Not Estab. %   Monocytes 7 Not Estab. %   Eos 1 Not Estab. %   Basos 1 Not Estab. %   Neutrophils Absolute 3.4 1.4 - 7.0 x10E3/uL   Lymphocytes Absolute 3.1 0.7 - 3.1 x10E3/uL   Monocytes Absolute 0.5 0.1 - 0.9 x10E3/uL   EOS (ABSOLUTE) 0.1 0.0 - 0.4 x10E3/uL   Basophils Absolute 0.1 0.0 - 0.2 x10E3/uL   Immature Granulocytes 0 Not Estab. %   Immature Grans (Abs) 0.0 0.0 - 0.1 x10E3/uL      Assessment & Plan:   Problem List Items Addressed This Visit       Cardiovascular and Mediastinum   Essential hypertension - Primary    Chronic, ongoing.  Currently remains above goal, recommend he restart Amlodipine which took in past + increase Losartan to 50 MG daily.  Educated him on this.  Recommend he monitor  BP at least a few mornings a week at home and document.  DASH diet at home.  Continue current medication regimen and adjust as needed.  Labs today: CMP, CBC, TSH.  Return in 4 weeks.        Relevant Medications   losartan (COZAAR) 50 MG tablet   amLODipine (NORVASC) 10 MG tablet   Other Relevant Orders   CBC with Differential/Platelet   Comprehensive metabolic panel   TSH     Nervous and Auditory   Non-recurrent acute serous otitis media of right ear    Ongoing issues since 09/13/22 with perforation to right side.  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  Mild discomfort still present.  Will trial Levofloxacin 500 MG daily due to ongoing symptoms and get into ENT for further assessment and recommendations.        Relevant Medications   levofloxacin (LEVAQUIN) 500 MG tablet   Other Relevant Orders   Ambulatory referral to ENT     Other   Elevated LDL cholesterol level    ASCVD 15.3% on last check.  Continue diet focus at this time and recommend complete cessation of smoking.  Recommend statin therapy, but he declines.  Labs today.  Relevant Orders   Comprehensive metabolic panel   Lipid Panel w/o Chol/HDL Ratio   Nicotine dependence, cigarettes, uncomplicated    I have recommended complete cessation of tobacco use. I have discussed various options available for assistance with tobacco cessation including over the counter methods (Nicotine gum, patch and lozenges). We also discussed prescription options (Chantix, Nicotine Inhaler / Nasal Spray). The patient is not interested in pursuing any prescription tobacco cessation options at this time. Continue to recommend lung CT screening.       Vitamin B12 deficiency    Noted on past labs, is not taking supplement as instructed.  Check levels today and start supplement as needed.      Relevant Orders   Vitamin B12   Vitamin D deficiency    Noted on past labs, is not taking supplement as instructed.  Check levels today  and start supplement as needed.      Relevant Orders   VITAMIN D 25 Hydroxy (Vit-D Deficiency, Fractures)   Other Visit Diagnoses     Benign prostatic hyperplasia without lower urinary tract symptoms       PSA on labs today.   Relevant Orders   PSA        Follow up plan: Return in about 4 weeks (around 10/31/2022) for HTN -- increased Losartan and added Amlodipine + Ear Check.

## 2022-10-03 NOTE — Assessment & Plan Note (Signed)
I have recommended complete cessation of tobacco use. I have discussed various options available for assistance with tobacco cessation including over the counter methods (Nicotine gum, patch and lozenges). We also discussed prescription options (Chantix, Nicotine Inhaler / Nasal Spray). The patient is not interested in pursuing any prescription tobacco cessation options at this time. Continue to recommend lung CT screening.

## 2022-10-03 NOTE — Assessment & Plan Note (Signed)
Ongoing issues since 09/13/22 with perforation to right side.  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  Mild discomfort still present.  Will trial Levofloxacin 500 MG daily due to ongoing symptoms and get into ENT for further assessment and recommendations.

## 2022-10-03 NOTE — Assessment & Plan Note (Signed)
Noted on past labs, is not taking supplement as instructed.  Check levels today and start supplement as needed.

## 2022-10-03 NOTE — Assessment & Plan Note (Signed)
ASCVD 15.3% on last check.  Continue diet focus at this time and recommend complete cessation of smoking.  Recommend statin therapy, but he declines.  Labs today.

## 2022-10-03 NOTE — Assessment & Plan Note (Signed)
Chronic, ongoing.  Currently remains above goal, recommend he restart Amlodipine which took in past + increase Losartan to 50 MG daily.  Educated him on this.  Recommend he monitor BP at least a few mornings a week at home and document.  DASH diet at home.  Continue current medication regimen and adjust as needed.  Labs today: CMP, CBC, TSH.  Return in 4 weeks.

## 2022-10-04 ENCOUNTER — Other Ambulatory Visit: Payer: Self-pay | Admitting: Nurse Practitioner

## 2022-10-04 LAB — CBC WITH DIFFERENTIAL/PLATELET
Basophils Absolute: 0.1 10*3/uL (ref 0.0–0.2)
Basos: 1 %
EOS (ABSOLUTE): 0.1 10*3/uL (ref 0.0–0.4)
Eos: 1 %
Hematocrit: 48.7 % (ref 37.5–51.0)
Hemoglobin: 15.9 g/dL (ref 13.0–17.7)
Immature Grans (Abs): 0 10*3/uL (ref 0.0–0.1)
Immature Granulocytes: 0 %
Lymphocytes Absolute: 2.2 10*3/uL (ref 0.7–3.1)
Lymphs: 27 %
MCH: 29.7 pg (ref 26.6–33.0)
MCHC: 32.6 g/dL (ref 31.5–35.7)
MCV: 91 fL (ref 79–97)
Monocytes Absolute: 0.6 10*3/uL (ref 0.1–0.9)
Monocytes: 7 %
Neutrophils Absolute: 5.2 10*3/uL (ref 1.4–7.0)
Neutrophils: 64 %
Platelets: 297 10*3/uL (ref 150–450)
RBC: 5.36 x10E6/uL (ref 4.14–5.80)
RDW: 14.6 % (ref 11.6–15.4)
WBC: 8.2 10*3/uL (ref 3.4–10.8)

## 2022-10-04 LAB — COMPREHENSIVE METABOLIC PANEL
ALT: 8 IU/L (ref 0–44)
AST: 15 IU/L (ref 0–40)
Albumin/Globulin Ratio: 1.7 (ref 1.2–2.2)
Albumin: 4.5 g/dL (ref 3.9–4.9)
Alkaline Phosphatase: 93 IU/L (ref 44–121)
BUN/Creatinine Ratio: 11 (ref 10–24)
BUN: 15 mg/dL (ref 8–27)
Bilirubin Total: 0.5 mg/dL (ref 0.0–1.2)
CO2: 24 mmol/L (ref 20–29)
Calcium: 10.1 mg/dL (ref 8.6–10.2)
Chloride: 99 mmol/L (ref 96–106)
Creatinine, Ser: 1.38 mg/dL — ABNORMAL HIGH (ref 0.76–1.27)
Globulin, Total: 2.7 g/dL (ref 1.5–4.5)
Glucose: 107 mg/dL — ABNORMAL HIGH (ref 70–99)
Potassium: 3.5 mmol/L (ref 3.5–5.2)
Sodium: 141 mmol/L (ref 134–144)
Total Protein: 7.2 g/dL (ref 6.0–8.5)
eGFR: 57 mL/min/{1.73_m2} — ABNORMAL LOW (ref >59)

## 2022-10-04 LAB — VITAMIN B12: Vitamin B-12: 259 pg/mL (ref 232–1245)

## 2022-10-04 LAB — TSH: TSH: 0.897 u[IU]/mL (ref 0.450–4.500)

## 2022-10-04 LAB — LIPID PANEL W/O CHOL/HDL RATIO
Cholesterol, Total: 167 mg/dL (ref 100–199)
HDL: 64 mg/dL (ref >39)
LDL Chol Calc (NIH): 87 mg/dL (ref 0–99)
Triglycerides: 84 mg/dL (ref 0–149)
VLDL Cholesterol Cal: 16 mg/dL (ref 5–40)

## 2022-10-04 LAB — PSA: Prostate Specific Ag, Serum: 2.5 ng/mL (ref 0.0–4.0)

## 2022-10-04 LAB — VITAMIN D 25 HYDROXY (VIT D DEFICIENCY, FRACTURES): Vit D, 25-Hydroxy: 14.2 ng/mL — ABNORMAL LOW (ref 30.0–100.0)

## 2022-10-04 MED ORDER — CHOLECALCIFEROL 1.25 MG (50000 UT) PO TABS: 1.0000 | ORAL_TABLET | ORAL | 4 refills | Status: DC

## 2022-10-04 MED ORDER — VITAMIN B-12 1000 MCG PO TABS: 1000.0000 ug | ORAL_TABLET | Freq: Every day | ORAL | 4 refills | Status: DC

## 2022-10-04 NOTE — Progress Notes (Signed)
Contacted via [place] morning [facility], your labs have returned: - Kidney function, creatinine and eGFR, shows some mild decline this check.  I would recommend taking your blood pressure medications consistently and ensure plenty of water intake daily.  We will recheck next visit.  Liver function, AST and ALT, is normal. - Vitamin D is low, I am sending in weekly supplement for you to restart.  Please ensure to take this for bone health.  B12 level also low, please start B12 supplement sent in. - Remainder of labs stable.  Any questions? Keep being awesome!!  Thank you for allowing me to participate in your care.  I appreciate you. Kindest regards, Tiandre Teall

## 2022-10-12 DIAGNOSIS — H6503 Acute serous otitis media, bilateral: Secondary | ICD-10-CM | POA: Diagnosis not present

## 2022-10-12 DIAGNOSIS — H6983 Other specified disorders of Eustachian tube, bilateral: Secondary | ICD-10-CM | POA: Diagnosis not present

## 2022-10-23 NOTE — Patient Instructions (Signed)
  Earache, Adult An earache, or ear pain, can be caused by many things, including: An infection. Ear wax buildup. Ear pressure. Something in the ear that should not be there (foreign body). A sore throat. Tooth problems. Jaw problems. Treatment of the earache will depend on the cause. If the cause is not clear or cannot be known, you may need to watch your symptoms until your earache goes away or until a cause is found. Follow these instructions at home: Medicines Take or apply over-the-counter and prescription medicines only as told by your health care provider. If you were prescribed antibiotics, use them as told by your health care provider. Do not stop using the antibiotic even if you start to feel better. Do not put anything in your ear other than medicine that is prescribed by your health care provider. Managing pain     If directed, apply heat to the affected area as often as told by your health care provider. Use the heat source that your health care provider recommends, such as a moist heat pack or a heating pad. Place a towel between your skin and the heat source. Leave the heat on for 20-30 minutes. If your skin turns bright red, remove the heat right away to prevent burns. The risk of burns is higher if you cannot feel pain, heat, or cold. If directed, put ice on the affected area. To do this: Put ice in a plastic bag. Place a towel between your skin and the bag. Leave the ice on for 20 minutes, 2-3 times a day. If your skin turns bright red, remove the ice right away to prevent skin damage. The risk of skin damage is higher if you cannot feel pain, heat, or cold.  General instructions Pay attention to any changes in your symptoms. Try resting in an upright position instead of lying down. This may help to reduce pressure in your ear and relieve pain. Chew gum if it helps to relieve your ear pain. Treat any allergies as told by your health care provider. Drink enough  fluid to keep your urine pale yellow. It is up to you to get the results of any tests that were done. Ask your health care provider, or the department that is doing the tests, when your results will be ready. Contact a health care provider if: Your pain does not improve within 2 days. Your earache gets worse. You have new symptoms. You have a fever. Get help right away if: You have a severe headache. You have a stiff neck. You have trouble swallowing. You have redness or swelling behind your ear. You have fluid or blood coming from your ear. You have hearing loss. You feel dizzy. This information is not intended to replace advice given to you by your health care provider. Make sure you discuss any questions you have with your health care provider. Document Revised: 11/22/2021 Document Reviewed: 11/22/2021 Elsevier Patient Education  2023 [employer]  

## 2022-10-25 ENCOUNTER — Ambulatory Visit (INDEPENDENT_AMBULATORY_CARE_PROVIDER_SITE_OTHER): Payer: BC Managed Care – PPO | Admitting: Nurse Practitioner

## 2022-10-25 ENCOUNTER — Encounter: Payer: Self-pay | Admitting: Nurse Practitioner

## 2022-10-25 VITALS — BP 130/74 | HR 70 | Temp 98.0°F | Ht 69.29 in | Wt 148.5 lb

## 2022-10-25 DIAGNOSIS — H6501 Acute serous otitis media, right ear: Secondary | ICD-10-CM

## 2022-10-25 DIAGNOSIS — E7800 Pure hypercholesterolemia, unspecified: Secondary | ICD-10-CM

## 2022-10-25 DIAGNOSIS — I10 Essential (primary) hypertension: Secondary | ICD-10-CM

## 2022-10-25 DIAGNOSIS — F17210 Nicotine dependence, cigarettes, uncomplicated: Secondary | ICD-10-CM

## 2022-10-25 MED ORDER — PREDNISONE 20 MG PO TABS: 40.0000 mg | ORAL_TABLET | Freq: Every day | ORAL | 0 refills | Status: AC

## 2022-10-25 NOTE — Assessment & Plan Note (Signed)
Overall erythema improved and membrane intact.  Some fluid remaining, follows up with ENT next week.  Will provide 40 MG Prednisone daily for 5 days to help with discomfort until he can see ENT for reassessment and recommendations.

## 2022-10-25 NOTE — Assessment & Plan Note (Addendum)
Chronic, improving. On recheck today is trending down to normal, suspect had some hypotension with Amlodipine.  Will continue Losartan at 50 MG at this time and adjust as needed.  Educated him on this.  Recommend he monitor BP at least a few mornings a week at home and document.  DASH diet at home.  Continue current medication regimen and adjust as needed.  Labs today: BMP.  Return in June for follow-up.
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
## 2022-10-25 NOTE — Assessment & Plan Note (Signed)
ASCVD 14.2%.  Continue diet focus at this time and recommend complete cessation of smoking.  Recommend statin therapy, but he declines at this time.  Further discuss next visit.

## 2022-10-25 NOTE — Assessment & Plan Note (Signed)
I have recommended complete cessation of tobacco use. I have discussed various options available for assistance with tobacco cessation including over the counter methods (Nicotine gum, patch and lozenges). We also discussed prescription options (Chantix, Nicotine Inhaler / Nasal Spray). The patient is not interested in pursuing any prescription tobacco cessation options at this time. Continue to recommend lung CT screening, he would like order for this.

## 2022-10-26 ENCOUNTER — Other Ambulatory Visit: Payer: Self-pay | Admitting: Nurse Practitioner

## 2022-10-26 LAB — BASIC METABOLIC PANEL
BUN/Creatinine Ratio: 21 (ref 10–24)
BUN: 18 mg/dL (ref 8–27)
CO2: 26 mmol/L (ref 20–29)
Calcium: 9.2 mg/dL (ref 8.6–10.2)
Chloride: 99 mmol/L (ref 96–106)
Creatinine, Ser: 0.87 mg/dL (ref 0.76–1.27)
Glucose: 112 mg/dL — ABNORMAL HIGH (ref 70–99)
Potassium: 3.2 mmol/L — ABNORMAL LOW (ref 3.5–5.2)
Sodium: 141 mmol/L (ref 134–144)
eGFR: 97 mL/min/{1.73_m2} (ref >59)

## 2022-10-26 MED ORDER — POTASSIUM CHLORIDE CRYS ER 20 MEQ PO TBCR: 20.0000 meq | EXTENDED_RELEASE_TABLET | Freq: Every day | ORAL | 0 refills | Status: DC

## 2022-10-26 NOTE — Progress Notes (Signed)
Contacted via [facility] -- needs lab only visit for 7 days please to recheck potassium   Good morning Brian Walker, your labs have returned.  Kidney function remains stable.  Potassium is low again.  I am sending in a potassium supplement for you to take daily for 7 days and then my staff will call to schedule lab only visit to recheck this in 7 days.  Any questions? Keep being stellar!!  Thank you for allowing me to participate in your care.  I appreciate you. Kindest regards, Lainie Daubert

## 2022-10-26 NOTE — Progress Notes (Signed)
LVM to call office back to get scheduled for lab only visit for 7 days.

## 2022-11-02 ENCOUNTER — Other Ambulatory Visit: Payer: Self-pay | Admitting: Student

## 2022-11-02 ENCOUNTER — Ambulatory Visit: Payer: BC Managed Care – PPO | Admitting: Nurse Practitioner

## 2022-11-02 DIAGNOSIS — R590 Localized enlarged lymph nodes: Secondary | ICD-10-CM

## 2022-11-02 DIAGNOSIS — H90A31 Mixed conductive and sensorineural hearing loss, unilateral, right ear with restricted hearing on the contralateral side: Secondary | ICD-10-CM | POA: Diagnosis not present

## 2022-11-02 DIAGNOSIS — H6501 Acute serous otitis media, right ear: Secondary | ICD-10-CM | POA: Diagnosis not present

## 2022-11-02 DIAGNOSIS — R591 Generalized enlarged lymph nodes: Secondary | ICD-10-CM | POA: Diagnosis not present

## 2022-11-21 ENCOUNTER — Encounter: Payer: Self-pay | Admitting: Nurse Practitioner

## 2022-11-21 ENCOUNTER — Ambulatory Visit
Admission: RE | Admit: 2022-11-21 | Discharge: 2022-11-21 | Disposition: A | Discharge status: Home / Self Care | Payer: BC Managed Care – PPO | Source: Ambulatory Visit | Attending: Student | Admitting: Student

## 2022-11-21 DIAGNOSIS — R590 Localized enlarged lymph nodes: Secondary | ICD-10-CM

## 2022-11-21 MED ORDER — IOPAMIDOL (ISOVUE-300) INJECTION 61%
75.0000 mL | Freq: Once | INTRAVENOUS | Status: AC | PRN
  Administered 2022-11-21: 75 mL via INTRAVENOUS

## 2022-11-30 DIAGNOSIS — J392 Other diseases of pharynx: Secondary | ICD-10-CM | POA: Diagnosis not present

## 2022-11-30 DIAGNOSIS — H6521 Chronic serous otitis media, right ear: Secondary | ICD-10-CM | POA: Diagnosis not present

## 2022-11-30 DIAGNOSIS — H90A31 Mixed conductive and sensorineural hearing loss, unilateral, right ear with restricted hearing on the contralateral side: Secondary | ICD-10-CM | POA: Diagnosis not present

## 2022-12-13 ENCOUNTER — Encounter: Payer: Self-pay | Admitting: Otolaryngology
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
## 2022-12-15 ENCOUNTER — Other Ambulatory Visit: Payer: Self-pay

## 2022-12-15 MED ORDER — CIPROFLOXACIN-DEXAMETHASONE 0.3-0.1 % OT SUSP
4.0000 [drp] | Freq: Two times a day (BID) | OTIC | 0 refills | Status: DC
  Filled 2022-12-15: qty 7.5, 5d supply, fill #0

## 2022-12-20 NOTE — Discharge Instructions (Signed)
[name] DISCHARGE INSTRUCTIONS FOR MYRINGOTOMY AND TUBE INSERTION  [facility] CREIGHTON VAUGHT, M.D.   Diet:   After surgery, the patient should take only liquids and foods as tolerated.  The patient may then have a regular diet after the effects of anesthesia have worn off, usually about four to six hours after surgery.  Activities:   The patient should rest until the effects of anesthesia have worn off.  After this, there are no restrictions on the normal daily activities.  Medications:   You will be given a prescription for antibiotic drops to be used in the ears postoperatively.  It is recommended to use 4 drops 2 times a day for 4 days, then the drops should be saved for possible future use.  The tubes should not cause any discomfort to the patient, but if there is any question, Tylenol should be given according to the instructions for the age of the patient.  Other medications should be continued normally.  Precautions:   Should there be recurrent drainage after the tubes are placed, the drops should be used for approximately 3-4 days.  If it does not clear, you should call the ENT office.  Earplugs:   Earplugs are only needed for those who are going to be submerged under water.  When taking a bath or shower and using a cup or showerhead to rinse hair, it is not necessary to wear earplugs.  These come in a variety of fashions, all of which can be obtained at our office.  However, if one is not able to come by the office, then silicone plugs can be found at most pharmacies.  It is not advised to stick anything in the ear that is not approved as an earplug.  Silly putty is not to be used as an earplug.  Swimming is allowed in patients after ear tubes are inserted, however, they must wear earplugs if they are going to be submerged under water.  For those children who are going to be swimming a lot, it is recommended to use a fitted ear mold, which can be  made by our audiologist.  If discharge is noticed from the ears, this most likely represents an ear infection.  We would recommend getting your eardrops and using them as indicated above.  If it does not clear, then you should call the ENT office.  For follow up, the patient should return to the ENT office three weeks postoperatively and then every six months as required by the doctor. 

## 2022-12-21 ENCOUNTER — Ambulatory Visit
Admission: RE | Admit: 2022-12-21 | Discharge: 2022-12-21 | Disposition: A | Discharge status: Home / Self Care | Payer: BC Managed Care – PPO | Attending: Otolaryngology | Admitting: Otolaryngology

## 2022-12-21 ENCOUNTER — Encounter
Admission: RE | Disposition: A | Discharge status: Home / Self Care | Payer: Self-pay | Source: Home / Self Care | Attending: Otolaryngology

## 2022-12-21 ENCOUNTER — Ambulatory Visit: Payer: BC Managed Care – PPO | Admitting: Anesthesiology

## 2022-12-21 ENCOUNTER — Encounter: Payer: Self-pay | Admitting: Otolaryngology

## 2022-12-21 ENCOUNTER — Other Ambulatory Visit: Payer: Self-pay

## 2022-12-21 DIAGNOSIS — I10 Essential (primary) hypertension: Secondary | ICD-10-CM | POA: Insufficient documentation

## 2022-12-21 DIAGNOSIS — H906 Mixed conductive and sensorineural hearing loss, bilateral: Secondary | ICD-10-CM | POA: Diagnosis not present

## 2022-12-21 DIAGNOSIS — H90A31 Mixed conductive and sensorineural hearing loss, unilateral, right ear with restricted hearing on the contralateral side: Secondary | ICD-10-CM | POA: Insufficient documentation

## 2022-12-21 DIAGNOSIS — F17200 Nicotine dependence, unspecified, uncomplicated: Secondary | ICD-10-CM | POA: Diagnosis not present

## 2022-12-21 DIAGNOSIS — J392 Other diseases of pharynx: Secondary | ICD-10-CM | POA: Diagnosis not present

## 2022-12-21 DIAGNOSIS — J449 Chronic obstructive pulmonary disease, unspecified: Secondary | ICD-10-CM | POA: Diagnosis not present

## 2022-12-21 DIAGNOSIS — H6521 Chronic serous otitis media, right ear: Secondary | ICD-10-CM | POA: Insufficient documentation

## 2022-12-21 HISTORY — PX: MYRINGOTOMY WITH TUBE PLACEMENT: SHX5663

## 2022-12-21 HISTORY — DX: Other emphysema: J43.8

## 2022-12-21 SURGERY — MYRINGOTOMY WITH TUBE PLACEMENT
Anesthesia: General | Site: Ear | Laterality: Right

## 2022-12-21 MED ORDER — LIDOCAINE HCL (CARDIAC) PF 100 MG/5ML IV SOSY
PREFILLED_SYRINGE | INTRAVENOUS | Status: DC | PRN
  Administered 2022-12-21: 80 mg via INTRATRACHEAL

## 2022-12-21 MED ORDER — ONDANSETRON HCL 4 MG/2ML IJ SOLN
INTRAMUSCULAR | Status: DC | PRN
  Administered 2022-12-21: 4 mg via INTRAVENOUS

## 2022-12-21 MED ORDER — PROPOFOL 10 MG/ML IV BOLUS
INTRAVENOUS | Status: DC | PRN
  Administered 2022-12-21: 150 mg via INTRAVENOUS

## 2022-12-21 MED ORDER — CIPROFLOXACIN-DEXAMETHASONE 0.3-0.1 % OT SUSP
OTIC | Status: DC | PRN
  Administered 2022-12-21: 1 [drp]

## 2022-12-21 MED ORDER — DEXAMETHASONE SODIUM PHOSPHATE 4 MG/ML IJ SOLN
INTRAMUSCULAR | Status: DC | PRN
  Administered 2022-12-21: 4 mg via INTRAVENOUS

## 2022-12-21 MED ORDER — MIDAZOLAM HCL 5 MG/5ML IJ SOLN
INTRAMUSCULAR | Status: DC | PRN
  Administered 2022-12-21: 2 mg via INTRAVENOUS

## 2022-12-21 MED ORDER — LACTATED RINGERS IV SOLN: INTRAVENOUS | Status: DC

## 2022-12-21 MED ORDER — FENTANYL CITRATE (PF) 100 MCG/2ML IJ SOLN
INTRAMUSCULAR | Status: DC | PRN
  Administered 2022-12-21: 100 ug via INTRAVENOUS

## 2022-12-21 SURGICAL SUPPLY — 9 items
BALL CTTN LRG ABS STRL LF (GAUZE/BANDAGES/DRESSINGS) ×1
BLADE MYR LANCE NRW W/HDL (BLADE) IMPLANT
CANISTER SUCT 1200ML W/VALVE (MISCELLANEOUS) ×1 IMPLANT
COTTONBALL LRG STERILE PKG (GAUZE/BANDAGES/DRESSINGS) ×1 IMPLANT
GLOVE SURG GAMMEX PI TX LF 7.5 (GLOVE) ×1 IMPLANT
STRAP BODY AND KNEE 60X3 (MISCELLANEOUS) ×1 IMPLANT
TOWEL OR 17X26 4PK STRL BLUE (TOWEL DISPOSABLE) ×1 IMPLANT
TUBE EAR VENT ARMSTRNG FM 1.14 (TUBING) IMPLANT
TUBING SUCTION CONN 0.25 STRL (TUBING) ×1 IMPLANT

## 2022-12-21 NOTE — H&P (Signed)
..  History and Physical paper copy reviewed and updated date of procedure and will be scanned into system.  Patient seen and examined.  Right ear marked.  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

## 2022-12-21 NOTE — Anesthesia Postprocedure Evaluation (Signed)
Anesthesia Post Note  Patient: Brian Walker  Procedure(s) Performed: MYRINGOTOMY WITH TUBE PLACEMENT (Right: Ear)  Patient location during evaluation: PACU Anesthesia Type: General Level of consciousness: awake and alert Pain management: pain level controlled Vital Signs Assessment: post-procedure vital signs reviewed and stable Respiratory status: spontaneous breathing, nonlabored ventilation, respiratory function stable and patient connected to nasal cannula oxygen Cardiovascular status: blood pressure returned to baseline and stable Postop Assessment: no apparent nausea or vomiting Anesthetic complications: no   No notable events documented.   Last Vitals:  Vitals:   12/21/22 0810 12/21/22 0815  BP: 121/85   Pulse: 63 65  Resp: 18 16  Temp:    SpO2: 94% 92%    Last Pain:  Vitals:   12/21/22 0810  TempSrc:   PainSc: 0-No pain                 Tessah Patchen C Salomon Ganser

## 2022-12-21 NOTE — Op Note (Signed)
..  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  PE tube placed anterior inferiorly  Procedure: With the patient in a comfortable supine position, general laryngeal mask anesthesia was administered.  At an appropriate level, microscope and speculum were used to examine and clean the RIGHT ear canal.  The findings were as described above.  An anterior inferior radial myringotomy incision was sharply executed.  Middle ear contents were suctioned clear with a size 5 otologic suction.  A PE tube was placed without difficulty using a Rosen pick and Facilities manager.  Ciprodex otic solution was instilled into the external canal, and insufflated into the middle ear.  A cotton ball was placed at the external meatus. Hemostasis was observed.  This side was completed.   Following this  The patient was returned to anesthesia, awakened, and transferred to recovery in stable condition.  Dispo:  PACU to home  Plan: Routine drop use and water precautions.  Recheck my office three weeks.   Roney Mans Kyri Dai 7:39 AM 12/21/2022

## 2022-12-21 NOTE — Transfer of Care (Signed)
Immediate Anesthesia Transfer of Care Note  Patient: Brian Walker  Procedure(s) Performed: MYRINGOTOMY WITH TUBE PLACEMENT (Right: Ear)  Patient Location: PACU  Anesthesia Type: General  Level of Consciousness: awake, alert  and patient cooperative  Airway and Oxygen Therapy: Patient Spontanous Breathing and Patient connected to supplemental oxygen  Post-op Assessment: Post-op Vital signs reviewed, Patient's Cardiovascular Status Stable, Respiratory Function Stable, Patent Airway and No signs of Nausea or vomiting  Post-op Vital Signs: Reviewed and stable  Complications: No notable events documented.

## 2022-12-22 ENCOUNTER — Encounter: Payer: Self-pay | Admitting: Otolaryngology

## 2022-12-27 ENCOUNTER — Ambulatory Visit: Payer: BC Managed Care – PPO | Admitting: Nurse Practitioner

## 2022-12-29 ENCOUNTER — Other Ambulatory Visit: Payer: Self-pay | Admitting: Nurse Practitioner

## 2022-12-29 NOTE — Telephone Encounter (Signed)
Unable to refill per protocol, Rx request is too soon. Last refill 10/03/22 for 30 and 4 refills.  Requested Prescriptions  Pending Prescriptions Disp Refills   losartan (COZAAR) 50 MG tablet [Pharmacy Med Name: LOSARTAN POTASSIUM 50 MG TAB] 90 tablet 1    Sig: TAKE 1 TABLET BY MOUTH EVERY DAY     Cardiovascular:  Angiotensin Receptor Blockers Failed - 12/29/2022  2:39 AM      Failed - K in normal range and within 180 days    Potassium  Date Value Ref Range Status  10/25/2022 3.2 (L) 3.5 - 5.2 mmol/L Final         Passed - Cr in normal range and within 180 days    Creatinine, Ser  Date Value Ref Range Status  10/25/2022 0.87 0.76 - 1.27 mg/dL Final         Passed - Patient is not pregnant      Passed - Last BP in normal range    BP Readings from Last 1 Encounters:  12/21/22 121/85         Passed - Valid encounter within last 6 months    Recent Outpatient Visits           2 months ago Essential hypertension   [place] [facility] [place], Corrie Dandy T, NP   2 months ago Essential hypertension   [place] [facility] [place], Garrett T, NP   3 months ago Acute otitis media of right ear with perforation   [place] [facility] M[place], Erin E, PA-C   3 months ago Acute otitis media, unspecified otitis media type   [place] [facility] Larae Grooms, NP   2 years ago Essential hypertension   [place] [facility] [place], Dorie Rank, NP       Future Appointments             In 6 days Cann[place], Dorie Rank, NP  [facility]

## 2023-01-01 NOTE — Patient Instructions (Signed)

## 2023-01-04 ENCOUNTER — Ambulatory Visit (INDEPENDENT_AMBULATORY_CARE_PROVIDER_SITE_OTHER): Payer: BC Managed Care – PPO | Admitting: Nurse Practitioner

## 2023-01-04 ENCOUNTER — Encounter: Payer: Self-pay | Admitting: Nurse Practitioner

## 2023-01-04 VITALS — BP 140/92 | HR 69 | Temp 98.3°F | Ht 69.02 in | Wt 145.2 lb

## 2023-01-04 DIAGNOSIS — F17210 Nicotine dependence, cigarettes, uncomplicated: Secondary | ICD-10-CM | POA: Diagnosis not present

## 2023-01-04 DIAGNOSIS — E7800 Pure hypercholesterolemia, unspecified: Secondary | ICD-10-CM

## 2023-01-04 DIAGNOSIS — E782 Mixed hyperlipidemia: Secondary | ICD-10-CM

## 2023-01-04 DIAGNOSIS — I10 Essential (primary) hypertension: Secondary | ICD-10-CM

## 2023-01-04 MED ORDER — ROSUVASTATIN CALCIUM 5 MG PO TABS: 5.0000 mg | ORAL_TABLET | Freq: Every day | ORAL | 3 refills | Status: DC

## 2023-01-04 MED ORDER — VALSARTAN 40 MG PO TABS: 40.0000 mg | ORAL_TABLET | Freq: Every day | ORAL | 12 refills | Status: DC

## 2023-01-04 NOTE — Assessment & Plan Note (Addendum)
I have recommended complete cessation of tobacco use. I have discussed various options available for assistance with tobacco cessation including over the counter methods (Nicotine gum, patch and lozenges). We also discussed prescription options (Chantix, Nicotine Inhaler / Nasal Spray). The patient is not interested in pursuing any prescription tobacco cessation options at this time. Continue to recommend lung CT screening, check on referral placed last visit.

## 2023-01-04 NOTE — Assessment & Plan Note (Signed)
Chronic, ongoing with elevated ASCVD scoring, last LDL improved.  We discussed at length and he is agreeable to trial of statin therapy for prevention.  Will start Rosuvastatin 5 MG nightly.  Educated him on this and to take at night.  Discussed side effects.  Plan for return in 8 weeks to recheck labs.

## 2023-01-04 NOTE — Assessment & Plan Note (Signed)
Chronic, uncontrolled -- not taking Losartan due to GI symptoms, Amlodipine caused hypotension.  Will switch to Valsartan 40 MG at this time and adjust as needed - recommend he take at night and not morning to see if reduce side effects.  Educated him on this.  Recommend he monitor BP at least a few mornings a week at home and document.  DASH diet at home.  Continue current medication regimen and adjust as needed.  Labs today: will obtain next visit.  Return in 8 weeks.

## 2023-01-04 NOTE — Progress Notes (Signed)
BP (!) 140/92 (BP Location: Left Arm, Patient Position: Sitting, Cuff Size: Normal)   Pulse 69   Temp 98.3 F (36.8 C) (Oral)   Ht 5' 9.02" (1.753 m)   Wt 145 lb 3.2 oz (65.9 kg)   SpO2 96%   BMI 21.43 kg/m    Subjective:    Patient ID: Brian Walker, male    DOB: 30-Aug-1960, 62 y.o.   MRN: [id number]  HPI: Brian Walker is a 62 y.o. male  Chief Complaint  Patient presents with   Hypertension    Patient states that the medication given at last visit makes him really sick. Patient has not been taking any medications for over a week   Hyperlipidemia   HYPERTENSION / HYPERLIPIDEMIA At visit on 10/25/22 continued on Losartan 50 MG.  Had hypotension with Amlodipine.  Recommended statin therapy.  He had surgery on his ears recently and he stopped all medications for clearance of system.  States the day before surgery he took Losartan, did not take it rest of week -- did take yesterday and Monday -- reports the Losartan makes him sick/belly discomfort -- takes after eating.  Reports he is sensitive to medications in general.  Since surgery no further ear pain or headache.  He is working on cutting back on smoking. Duration of hypertension: chronic BP monitoring frequency: not checking BP range:  BP medication side effects: no Duration of hyperlipidemia: chronic Cholesterol supplements: none Aspirin: no Recent stressors: no Recurrent headaches: no Visual changes: no Palpitations: no Dyspnea: no Chest pain: no Lower extremity edema: no Dizzy/lightheaded: no  The 10-year ASCVD risk score (Arnett DK, et al., 2019) is: 16%   Values used to calculate the score:     Age: 23 years     Sex: Male     Is Non-Hispanic African American: No     Diabetic: No     Tobacco smoker: Yes     Systolic Blood Pressure: 140 mmHg     Is BP treated: Yes     HDL Cholesterol: 64 mg/dL     Total Cholesterol: 167 mg/dL  Relevant past medical, surgical, family and social history reviewed and  updated as indicated. Interim medical history since our last visit reviewed. Allergies and medications reviewed and updated.  Review of Systems  Constitutional:  Negative for activity change, diaphoresis, fatigue and fever.  Respiratory:  Negative for cough, chest tightness, shortness of breath and wheezing.   Cardiovascular:  Negative for chest pain, palpitations and leg swelling.  Gastrointestinal: Negative.   Neurological: Negative.   Psychiatric/Behavioral: Negative.      Per HPI unless specifically indicated above     Objective:    BP (!) 140/92 (BP Location: Left Arm, Patient Position: Sitting, Cuff Size: Normal)   Pulse 69   Temp 98.3 F (36.8 C) (Oral)   Ht 5' 9.02" (1.753 m)   Wt 145 lb 3.2 oz (65.9 kg)   SpO2 96%   BMI 21.43 kg/m   Wt Readings from Last 3 Encounters:  01/04/23 145 lb 3.2 oz (65.9 kg)  12/21/22 144 lb 3.2 oz (65.4 kg)  10/25/22 148 lb 8 oz (67.4 kg)    Physical Exam Vitals and nursing note reviewed.  Constitutional:      General: He is awake. He is not in acute distress.    Appearance: He is well-developed and well-groomed. He is not ill-appearing or toxic-appearing.  HENT:     Head: Normocephalic.  Eyes:  General: Lids are normal.     Extraocular Movements: Extraocular movements intact.     Conjunctiva/sclera: Conjunctivae normal.  Neck:     Thyroid: No thyromegaly.     Vascular: No carotid bruit.  Cardiovascular:     Rate and Rhythm: Normal rate and regular rhythm.     Heart sounds: Normal heart sounds.  Pulmonary:     Effort: No accessory muscle usage or respiratory distress.     Breath sounds: Normal breath sounds.  Abdominal:     General: Bowel sounds are normal. There is no distension.     Palpations: Abdomen is soft.     Tenderness: There is no abdominal tenderness.  Musculoskeletal:     Cervical back: Full passive range of motion without pain.     Right lower leg: No edema.     Left lower leg: No edema.  Lymphadenopathy:      Cervical: No cervical adenopathy.  Skin:    General: Skin is warm.     Capillary Refill: Capillary refill takes less than 2 seconds.  Neurological:     Mental Status: He is alert and oriented to person, place, and time.     Deep Tendon Reflexes: Reflexes are normal and symmetric.     Reflex Scores:      Brachioradialis reflexes are 2+ on the right side and 2+ on the left side.      Patellar reflexes are 2+ on the right side and 2+ on the left side. Psychiatric:        Attention and Perception: Attention normal.        Mood and Affect: Mood normal.        Speech: Speech normal.        Behavior: Behavior normal. Behavior is cooperative.        Thought Content: Thought content normal.    Results for orders placed or performed in visit on 10/25/22  Basic Metabolic Panel (BMET)  Result Value Ref Range   Glucose 112 (H) 70 - 99 mg/dL   BUN 18 8 - 27 mg/dL   Creatinine, Ser 1.61 0.76 - 1.27 mg/dL   eGFR 97 >09 UE/AVW/0.98   BUN/Creatinine Ratio 21 10 - 24   Sodium 141 134 - 144 mmol/L   Potassium 3.2 (L) 3.5 - 5.2 mmol/L   Chloride 99 96 - 106 mmol/L   CO2 26 20 - 29 mmol/L   Calcium 9.2 8.6 - 10.2 mg/dL      Assessment & Plan:   Problem List Items Addressed This Visit       Cardiovascular and Mediastinum   Essential hypertension - Primary    Chronic, uncontrolled -- not taking Losartan due to GI symptoms, Amlodipine caused hypotension.  Will switch to Valsartan 40 MG at this time and adjust as needed - recommend he take at night and not morning to see if reduce side effects.  Educated him on this.  Recommend he monitor BP at least a few mornings a week at home and document.  DASH diet at home.  Continue current medication regimen and adjust as needed.  Labs today: will obtain next visit.  Return in 8 weeks.      Relevant Medications   valsartan (DIOVAN) 40 MG tablet   rosuvastatin (CRESTOR) 5 MG tablet     Other   Mixed hyperlipidemia    Chronic, ongoing with elevated  ASCVD scoring, last LDL improved.  We discussed at length and he is agreeable to trial of statin therapy  for prevention.  Will start Rosuvastatin 5 MG nightly.  Educated him on this and to take at night.  Discussed side effects.  Plan for return in 8 weeks to recheck labs.      Relevant Medications   valsartan (DIOVAN) 40 MG tablet   rosuvastatin (CRESTOR) 5 MG tablet   Nicotine dependence, cigarettes, uncomplicated    I have recommended complete cessation of tobacco use. I have discussed various options available for assistance with tobacco cessation including over the counter methods (Nicotine gum, patch and lozenges). We also discussed prescription options (Chantix, Nicotine Inhaler / Nasal Spray). The patient is not interested in pursuing any prescription tobacco cessation options at this time. Continue to recommend lung CT screening, check on referral placed last visit.         Follow up plan: Return in about 8 weeks (around 03/01/2023) for HTN/HLD -- started Valsartan and Rosuvastatin.

## 2023-01-06 ENCOUNTER — Other Ambulatory Visit: Payer: Self-pay | Admitting: *Deleted

## 2023-01-06 DIAGNOSIS — Z122 Encounter for screening for malignant neoplasm of respiratory organs: Secondary | ICD-10-CM

## 2023-01-06 DIAGNOSIS — Z87891 Personal history of nicotine dependence: Secondary | ICD-10-CM

## 2023-01-06 DIAGNOSIS — F17210 Nicotine dependence, cigarettes, uncomplicated: Secondary | ICD-10-CM

## 2023-01-13 DIAGNOSIS — H6981 Other specified disorders of Eustachian tube, right ear: Secondary | ICD-10-CM | POA: Diagnosis not present

## 2023-02-03 DIAGNOSIS — J392 Other diseases of pharynx: Secondary | ICD-10-CM | POA: Diagnosis not present

## 2023-02-03 DIAGNOSIS — H6981 Other specified disorders of Eustachian tube, right ear: Secondary | ICD-10-CM | POA: Diagnosis not present

## 2023-02-03 DIAGNOSIS — H9201 Otalgia, right ear: Secondary | ICD-10-CM | POA: Diagnosis not present

## 2023-02-07 ENCOUNTER — Encounter: Payer: Self-pay | Admitting: Physician Assistant

## 2023-02-07 ENCOUNTER — Ambulatory Visit (INDEPENDENT_AMBULATORY_CARE_PROVIDER_SITE_OTHER): Payer: BC Managed Care – PPO | Admitting: Physician Assistant

## 2023-02-07 ENCOUNTER — Ambulatory Visit
Admission: RE | Admit: 2023-02-07 | Discharge: 2023-02-07 | Disposition: A | Discharge status: Home / Self Care | Payer: BC Managed Care – PPO | Source: Ambulatory Visit | Attending: Acute Care | Admitting: Acute Care

## 2023-02-07 DIAGNOSIS — Z87891 Personal history of nicotine dependence: Secondary | ICD-10-CM | POA: Diagnosis not present

## 2023-02-07 DIAGNOSIS — F17210 Nicotine dependence, cigarettes, uncomplicated: Secondary | ICD-10-CM

## 2023-02-07 DIAGNOSIS — Z122 Encounter for screening for malignant neoplasm of respiratory organs: Secondary | ICD-10-CM | POA: Diagnosis not present

## 2023-02-07 NOTE — Progress Notes (Signed)
Virtual Visit via Telephone Note  I connected with Wallace Keller on 02/07/23 at  9:13 AM  by telephone and verified that I am speaking with the correct person using two identifiers.  Location: Patient: home Provider: working virtually from home   I discussed the limitations, risks, security and privacy concerns of performing an evaluation and management service by telephone and the availability of in person appointments. I also discussed with the patient that there may be a patient responsible charge related to this service. The patient expressed understanding and agreed to proceed.     Shared Decision Making Visit Lung Cancer Screening Program [phone])   Eligibility: Age 44 Pack Years Smoking History Calculation 60 (# packs/per year x # years smoked) Recent History of coughing up blood  No Unexplained weight loss? No ( >Than 15 pounds within the last 6 months ) Prior History Lung / other cancer No (Diagnosis within the last 5 years already requiring surveillance chest CT Scans). Smoking Status Current Smoker  Visit Components: Discussion included one or more decision making aids. Yes Discussion included risk/benefits of screening. Yes Discussion included potential follow up diagnostic testing for abnormal scans. Yes Discussion included meaning and risk of over diagnosis. Yes Discussion included meaning and risk of False Positives. Yes Discussion included meaning of total radiation exposure. Yes  Counseling Included: Importance of adherence to annual lung cancer LDCT screening. Yes Impact of comorbidities on ability to participate in the program. Yes Ability and willingness to under diagnostic treatment: Yes  Smoking Cessation Counseling: Current Smokers:  Discussed importance of smoking cessation. Yes Information about tobacco cessation classes and interventions provided to patient. Yes Symptomatic Patient. No Diagnosis Code: Tobacco Use Z72.0 Asymptomatic Patient  Yes  Counseling (Intermediate counseling: > three minutes counseling) F0932 Information about tobacco cessation classes and interventions provided to patient. Yes Written Order for Lung Cancer Screening with LDCT placed in Epic. Yes (CT Chest Lung Cancer Screening Low Dose W/O CM) TFT7322 Z12.2-Screening of respiratory organs Z87.891-Personal history of nicotine dependence   I have spent 25 minutes of face to face/ virtual visit  time with the patient discussing the risks and benefits of lung cancer screening. We discussed the above noted topics. We paused at intervals to allow for questions to be asked and answered to ensure understanding.We discussed that the single most powerful action that anyone can take to decrease their risk of developing lung cancer is to quit smoking.  We discussed options for tools to aid in quitting smoking including nicotine replacement therapy, non-nicotine medications, support groups, Quit Smart classes, and behavior modification. We discussed that often times setting smaller, more achievable goals, such as eliminating 1 cigarette a day for a week and then 2 cigarettes a day for a week can be helpful in slowly decreasing the number of cigarettes smoked. I provided  them  with smoking cessation  information  with contact information for community resources, classes, free nicotine replacement therapy, and access to mobile apps, text messaging, and on-line smoking cessation help. I have also provided  them  the office contact information in the event they have any questions. We discussed the time and location of the scan, and that either Abigail Miyamoto RN, Karlton Lemon, RN  or I will call / send a letter with the results within 24-72 hours of receiving them. The patient verbalized understanding of all of  the above and had no further questions upon leaving the office. They have my contact information in the event they have any  further questions.  I spent two minutes counseling  on smoking cessation and the health risks of continued tobacco abuse.  I explained to the patient that there has been a high incidence of coronary artery disease noted on these exams. I explained that this is a non-gated exam therefore degree or severity cannot be determined. This patient is on statin therapy. I have asked the patient to follow-up with their PCP regarding any incidental finding of coronary artery disease and management with diet or medication as their PCP  feels is clinically indicated. The patient verbalized understanding of the above and had no further questions upon completion of the visit.    Darcella Gasman Alvis Edgell, PA-C

## 2023-02-07 NOTE — Patient Instructions (Signed)

## 2023-02-10 ENCOUNTER — Other Ambulatory Visit: Payer: Self-pay | Admitting: Acute Care

## 2023-02-10 ENCOUNTER — Encounter: Payer: Self-pay | Admitting: Nurse Practitioner

## 2023-02-10 DIAGNOSIS — Z122 Encounter for screening for malignant neoplasm of respiratory organs: Secondary | ICD-10-CM

## 2023-02-10 DIAGNOSIS — F17210 Nicotine dependence, cigarettes, uncomplicated: Secondary | ICD-10-CM

## 2023-02-10 DIAGNOSIS — Z87891 Personal history of nicotine dependence: Secondary | ICD-10-CM

## 2023-02-10 DIAGNOSIS — J432 Centrilobular emphysema: Secondary | ICD-10-CM | POA: Insufficient documentation

## 2023-02-10 DIAGNOSIS — I700 Atherosclerosis of aorta: Secondary | ICD-10-CM | POA: Insufficient documentation

## 2023-02-23 NOTE — Patient Instructions (Signed)
Be Involved in Caring For Your Health:  Taking Medications When medications are taken as directed, they can greatly improve your health. But if they are not taken as prescribed, they may not work. In some cases, not taking them correctly can be harmful. To help ensure your treatment remains effective and safe, understand your medications and how to take them. Bring your medications to each visit for review by your provider.  Your lab results, notes, and after visit summary will be available on My Chart. We strongly encourage you to use this feature. If lab results are abnormal the clinic will contact you with the appropriate steps. If the clinic does not contact you assume the results are satisfactory. You can always view your results on My Chart. If you have questions regarding your health or results, please contact the clinic during office hours. You can also ask questions on My Chart.  We at [facility] are grateful that you chose [place] to provide your care. We strive to provide evidence-based and compassionate care and are always looking for feedback. If you get a survey from the clinic please complete this so we can hear your opinions.  Living with COPD Being diagnosed with chronic obstructive pulmonary disease (COPD) changes your life physically and emotionally. Having COPD can affect your ability to work and do things you enjoy. COPD is not the same for everyone, and it may change over time. Your health care providers can help you come up with the COPD management plan that works best for you. How to manage lifestyle changes Treatment plan Work closely with your health care providers. Follow your COPD management plan. This plan includes: Instructions about activities, exercises, diet, medicines, what to do when COPD flares up, and when to call your health care provider. A pulmonary rehabilitation program. In pulmonary rehab, you will learn about COPD, do exercises for fitness and  breathing, and get support from health care providers and other people who have COPD. Managing emotions and stress Living with a chronic disease means you may also struggle with stressful emotions, such as sadness, fear, and worry. Here are some ways to manage these emotions: Talk to someone about your fear, anxiety, depression, or stress. Learn strategies to avoid or reduce stress and ask for help if you are struggling with depression or anxiety. Consider joining a COPD support group, online or in person.  Adjusting to changes COPD may limit the things you can do, but you can make certain changes to help you cope with the diagnosis. Ask for help when you need it. Getting support from friends, family, and your health care team is an important part of managing the condition. Try to get regular exercise as prescribed by a health care provider or pulmonary rehab team. Exercising can help COPD, even if you are a bit short of breath. Take steps to prevent infection and protect your lungs: Wash your hands often and avoid being in crowds. Stay away from friends and family members who are sick. Check your local air quality each day, and stay out of areas where air pollution is likely. How to recognize changes in your condition Recognizing changes in your COPD COPD is a progressive disease. It is important to let the health care team know if your COPD is getting worse. Your treatment plan may need to change. Watch for: Increased shortness of breath, wheezing, cough, or fatigue. Loss of ability to exercise or perform daily activities, like climbing stairs. More frequent symptom flares. Signs  of depression or anxiety. Recognizing stress It is normal to have additional stress when you have COPD. However, prolonged stress and anxiety can make COPD worse and lead to depression. Recognize the warning signs, which include: Feeling sad or worried more often or most of the time. Having less energy and losing  interest in pleasurable activities. Changes in your appetite or sleeping patterns. Being easily angered or irritated. Having unexplained aches and pains, digestive problems, or headaches. Follow these instructions at home: Eating and drinking  Eat foods that are high in fiber, such as fresh fruits and vegetables, whole grains, and beans. Limit foods that are high in fat and processed sugars, such as fried or sweet foods. Follow a balanced diet and maintain a healthy weight. Being overweight or underweight can make COPD worse. You may work with a Data processing manager as part of your pulmonary rehab program. Drink enough fluid to keep your urine pale yellow. If you drink alcohol: Limit how much you have to: 0-1 drink a day for women who are not pregnant. 0-2 drinks a day for men. Know how much alcohol is in your drink. In the [place] one drink equals one 12 oz bottle of beer (355 mL), one 5 oz glass of wine (148 mL), or one 1 oz glass of hard liquor (44 mL). Lifestyle If you smoke, the most important thing that you can do is to stop smoking. Continuing to smoke will cause the disease to progress faster. Do not use any products that contain nicotine or tobacco. These products include cigarettes, chewing tobacco, and vaping devices, such as e-cigarettes. If you need help quitting, ask your health care provider. Avoid exposure to things that irritate your lungs, such as smoke, chemicals, and fumes. Activity Balance exercise and rest. Take short walks every 1-2 hours. This is important to improve blood flow and breathing. Ask for help if you feel weak or unsteady. Do exercises that include controlled breathing with body movement, such as tai chi. General instructions Take over-the-counter and prescription medicines only as told by your health care provider. Take vitamin and protein supplements as told by your health care provider or dietitian. Practice good oral hygiene and see your dental care provider  regularly. An oral infection can also spread to your lungs. Make sure you receive all the vaccines that your health care provider recommends. Keep all follow-up visits. This is important. Contact a health care provider if you: Are struggling to manage your COPD. Have emotional stress that interferes with your ability to cope with COPD. Get help right away if you: Have thoughts of suicide, death, or hurting yourself or others. If you ever feel like you may hurt yourself or others, or have thoughts about taking your own life, get help right away. Go to your nearest emergency department or: Call your local emergency services (911 in the [place] Call a suicide crisis helpline, such as the National Suicide Prevention Lifeline at 831-272-4408 or 988 in the [place] This is open 24 hours a day in the [place] Text the Crisis Text Line at (949) 528-7446 (in the [place] Summary Being diagnosed with chronic obstructive pulmonary disease (COPD) changes your life physically and emotionally. Work with your health care providers and follow your COPD management plan. A pulmonary rehabilitation program is an important part of COPD management. Prolonged stress, anxiety, and depression can make COPD worse. Let your health care provider know if emotional stress interferes with your ability to cope with and manage COPD. This information is not intended to  replace advice given to you by your health care provider. Make sure you discuss any questions you have with your health care provider. Document Revised: 02/03/2021 Document Reviewed: 07/29/2020 Elsevier Patient Education  2024 [employer].

## 2023-03-01 ENCOUNTER — Encounter: Payer: Self-pay | Admitting: Nurse Practitioner

## 2023-03-01 ENCOUNTER — Ambulatory Visit (INDEPENDENT_AMBULATORY_CARE_PROVIDER_SITE_OTHER): Payer: BC Managed Care – PPO | Admitting: Nurse Practitioner

## 2023-03-01 VITALS — BP 142/88 | HR 69 | Temp 98.3°F | Ht 69.0 in | Wt 141.0 lb

## 2023-03-01 DIAGNOSIS — I700 Atherosclerosis of aorta: Secondary | ICD-10-CM | POA: Diagnosis not present

## 2023-03-01 DIAGNOSIS — J432 Centrilobular emphysema: Secondary | ICD-10-CM

## 2023-03-01 DIAGNOSIS — E782 Mixed hyperlipidemia: Secondary | ICD-10-CM

## 2023-03-01 DIAGNOSIS — I10 Essential (primary) hypertension: Secondary | ICD-10-CM

## 2023-03-01 DIAGNOSIS — F17210 Nicotine dependence, cigarettes, uncomplicated: Secondary | ICD-10-CM

## 2023-03-01 DIAGNOSIS — R051 Acute cough: Secondary | ICD-10-CM | POA: Diagnosis not present

## 2023-03-01 LAB — VERITOR FLU A/B WAIVED
Influenza A: NEGATIVE
Influenza B: NEGATIVE

## 2023-03-01 MED ORDER — VALSARTAN 80 MG PO TABS
80.0000 mg | ORAL_TABLET | Freq: Every day | ORAL | 2 refills | Status: DC
Start: 2023-03-01 — End: 2023-03-29

## 2023-03-01 NOTE — Assessment & Plan Note (Signed)
Chronic, ongoing.  Continue current medication regimen and adjust as needed. Lipid panel today. 

## 2023-03-01 NOTE — Assessment & Plan Note (Signed)
Chronic, noted on lung screening 02/10/23.  No current inhalers.  Will obtain spirometry at future visit when feeling better.  Recommend complete cessation of smoking.  Continue annual lung screening.  Inhalers as needed in future.

## 2023-03-01 NOTE — Assessment & Plan Note (Signed)
Noted on lung screening 02/10/23, discussed with him today and educated him on finding.  Continue Rosuvastatin daily and recommend a daily Baby ASA.

## 2023-03-01 NOTE — Assessment & Plan Note (Addendum)
I have recommended complete cessation of tobacco use. I have discussed various options available for assistance with tobacco cessation including over the counter methods (Nicotine gum, patch and lozenges). We also discussed prescription options (Chantix, Nicotine Inhaler / Nasal Spray). The patient is not interested in pursuing any prescription tobacco cessation options at this time.  Continue annual lung screening. 

## 2023-03-01 NOTE — Progress Notes (Signed)
BP (!) 142/88 (BP Location: Left Arm, Patient Position: Sitting, Cuff Size: Normal)   Pulse 69   Temp 98.3 F (36.8 C) (Oral)   Ht 5\' 9"  (1.753 m)   Wt 141 lb (64 kg)   SpO2 98%   BMI 20.82 kg/m    Subjective:    Patient ID: Brian Walker, male    DOB: [birth year], 62 y.o.   MRN: [id number]  HPI: Brian Walker is a 62 y.o. male  Chief Complaint  Patient presents with   Hyperlipidemia   Hypertension   URI    Pt sates he has been feeling bad since Sunday. States he has been having body aches, sweats, congestion, and a cough.    HYPERTENSION / HYPERLIPIDEMIA Follow-up for BP and HLD medication changes.  Stopped Losartan on 01/04/23 and changed to Valsartan 40 MG due to GI symptoms with Losartan.  Started on Rosuvastatin for HLD.  Tolerating both new medications well.    Amlodipine caused hypotension in past. Satisfied with current treatment? yes Duration of hypertension: chronic BP monitoring frequency: a few times a month BP range: before illness 140/80 range BP medication side effects: no Duration of hyperlipidemia: chronic Cholesterol medication side effects: no Cholesterol supplements: none Medication compliance: good compliance Aspirin: no Recent stressors: no Recurrent headaches: no Visual changes: no Palpitations: no Dyspnea: no Chest pain: no Lower extremity edema: no Dizzy/lightheaded: no   COPD Had initial lung screening on 02/10/23 with mild centrilobular and paraseptal emphysema and aortic atherosclerosis.  Is a smoker, has cut back to 10-12 a day.  Is working on picking up cigarette pack less. COPD status: stable Satisfied with current treatment?: yes Oxygen use: no Dyspnea frequency: no Cough frequency: has current URI, with tickle in throat Rescue inhaler frequency:  no Limitation of activity: no Productive cough: no Last Spirometry: none Pneumovax: Not up to Date Influenza: Not up to Date   UPPER RESPIRATORY TRACT INFECTION On Sunday  started with illness, 02/26/23 with itchy throat. Fever: yes -- 101.8 last night Cough: yes Shortness of breath: no Wheezing: no Chest pain: no Chest tightness: no Chest congestion: no Nasal congestion: yes Runny nose: yes Post nasal drip: yes Sneezing: no Sore throat: no Swollen glands: no Sinus pressure: yes Headache: yes Face pain: no Toothache: no Ear pain: yes bilateral -- had tubes placed in ears 12/21/22 Ear pressure: yes bilateral Eyes red/itching:no Eye drainage/crusting: no  Vomiting: no Rash: no Fatigue: yes Sick contacts: no Strep contacts: no  Context: fluctuating Recurrent sinusitis: no Relief with OTC cold/cough medications: no  Treatments attempted: Ibuprofen    Relevant past medical, surgical, family and social history reviewed and updated as indicated. Interim medical history since our last visit reviewed. Allergies and medications reviewed and updated.  Review of Systems  Constitutional:  Positive for chills, fatigue and fever. Negative for activity change, appetite change and diaphoresis.  HENT:  Positive for congestion, ear pain, postnasal drip, rhinorrhea, sinus pressure, sinus pain and sore throat. Negative for ear discharge, sneezing and voice change.   Respiratory:  Positive for cough. Negative for chest tightness, shortness of breath and wheezing.   Cardiovascular:  Negative for chest pain, palpitations and leg swelling.  Gastrointestinal: Negative.   Neurological:  Positive for headaches.  Psychiatric/Behavioral: Negative.     Per HPI unless specifically indicated above     Objective:    BP (!) 142/88 (BP Location: Left Arm, Patient Position: Sitting, Cuff Size: Normal)   Pulse 69   Temp 98.3  F (36.8 C) (Oral)   Ht 5\' 9"  (1.753 m)   Wt 141 lb (64 kg)   SpO2 98%   BMI 20.82 kg/m   Wt Readings from Last 3 Encounters:  03/01/23 141 lb (64 kg)  01/04/23 145 lb 3.2 oz (65.9 kg)  12/21/22 144 lb 3.2 oz (65.4 kg)    Physical  Exam Vitals and nursing note reviewed.  Constitutional:      General: He is awake. He is not in acute distress.    Appearance: He is well-developed and well-groomed. He is not ill-appearing or toxic-appearing.  HENT:     Head: Normocephalic.     Right Ear: Hearing, ear canal and external ear normal. No tenderness. A middle ear effusion is present. Tympanic membrane is not injected or perforated.     Left Ear: Hearing, ear canal and external ear normal. No tenderness. A middle ear effusion is present. Tympanic membrane is not injected or perforated.     Nose: Rhinorrhea present. Rhinorrhea is clear.     Right Sinus: No maxillary sinus tenderness or frontal sinus tenderness.     Left Sinus: No maxillary sinus tenderness or frontal sinus tenderness.     Mouth/Throat:     Mouth: Mucous membranes are moist.     Pharynx: Posterior oropharyngeal erythema (mild) and postnasal drip present. No pharyngeal swelling or oropharyngeal exudate.  Eyes:     General: Lids are normal.     Extraocular Movements: Extraocular movements intact.     Conjunctiva/sclera: Conjunctivae normal.  Neck:     Thyroid: No thyromegaly.     Vascular: No carotid bruit.  Cardiovascular:     Rate and Rhythm: Normal rate and regular rhythm.     Heart sounds: Normal heart sounds.  Pulmonary:     Effort: No accessory muscle usage or respiratory distress.     Breath sounds: Wheezing present. No decreased breath sounds or rhonchi.     Comments: Intermittent wheezes noted throughout. Abdominal:     General: Bowel sounds are normal. There is no distension.     Palpations: Abdomen is soft.     Tenderness: There is no abdominal tenderness.  Musculoskeletal:     Cervical back: Full passive range of motion without pain.     Right lower leg: No edema.     Left lower leg: No edema.  Lymphadenopathy:     Cervical: No cervical adenopathy.  Skin:    General: Skin is warm.     Capillary Refill: Capillary refill takes less than 2  seconds.  Neurological:     Mental Status: He is alert and oriented to person, place, and time.     Deep Tendon Reflexes: Reflexes are normal and symmetric.     Reflex Scores:      Brachioradialis reflexes are 2+ on the right side and 2+ on the left side.      Patellar reflexes are 2+ on the right side and 2+ on the left side. Psychiatric:        Attention and Perception: Attention normal.        Mood and Affect: Mood normal.        Speech: Speech normal.        Behavior: Behavior normal. Behavior is cooperative.        Thought Content: Thought content normal.     Results for orders placed or performed in visit on 10/25/22  Basic Metabolic Panel (BMET)  Result Value Ref Range   Glucose 112 (H) 70 -  99 mg/dL   BUN 18 8 - 27 mg/dL   Creatinine, Ser 0.86 0.76 - 1.27 mg/dL   eGFR 97 >57 QI/ONG/2.95   BUN/Creatinine Ratio 21 10 - 24   Sodium 141 134 - 144 mmol/L   Potassium 3.2 (L) 3.5 - 5.2 mmol/L   Chloride 99 96 - 106 mmol/L   CO2 26 20 - 29 mmol/L   Calcium 9.2 8.6 - 10.2 mg/dL      Assessment & Plan:   Problem List Items Addressed This Visit       Cardiovascular and Mediastinum   Aortic atherosclerosis (HCC)    Noted on lung screening 02/10/23, discussed with him today and educated him on finding.  Continue Rosuvastatin daily and recommend a daily Baby ASA.      Relevant Medications   valsartan (DIOVAN) 80 MG tablet   Other Relevant Orders   Basic metabolic panel   Lipid Panel w/o Chol/HDL Ratio   Essential hypertension    Chronic, uncontrolled, continues to have elevations above goal in office and at home.  Will increase to Valsartan 80 MG at this time and adjust as needed - recommend he take at night and not morning to see if reduce side effects.  He is tolerating this.  Educated him on medication and changes.  Recommend he monitor BP at least a few mornings a week at home and document.  DASH diet at home.  Labs today: BMP.  Return in 4 weeks.      Relevant  Medications   valsartan (DIOVAN) 80 MG tablet     Respiratory   Centrilobular emphysema (HCC) - Primary    Chronic, noted on lung screening 02/10/23.  No current inhalers.  Will obtain spirometry at future visit when feeling better.  Recommend complete cessation of smoking.  Continue annual lung screening.  Inhalers as needed in future.        Other   Acute cough    Started 4 days ago.  Flu negative on testing.  Covid test sent, suspect this may be positive.  If returns tomorrow with positive result (day 5) will send in Paxlovid, discussed with him at length.  For now he prefers no prescription medication.  Recommend: - Increased rest - Increasing Fluids - Acetaminophen as needed for fever/pain.  No Ibuprofen due to HTN. - Salt water gargling, chloraseptic spray and throat lozenges - Mucinex.  - Saline sinus flushes or a neti pot.  - Humidifying the air.       Relevant Orders   Veritor Flu A/B Waived   Novel Coronavirus, NAA ([facility])   Mixed hyperlipidemia    Chronic, ongoing.  Continue current medication regimen and adjust as needed.  Lipid panel today.      Relevant Medications   valsartan (DIOVAN) 80 MG tablet   Other Relevant Orders   Basic metabolic panel   Lipid Panel w/o Chol/HDL Ratio   Nicotine dependence, cigarettes, uncomplicated    I have recommended complete cessation of tobacco use. I have discussed various options available for assistance with tobacco cessation including over the counter methods (Nicotine gum, patch and lozenges). We also discussed prescription options (Chantix, Nicotine Inhaler / Nasal Spray). The patient is not interested in pursuing any prescription tobacco cessation options at this time. Continue annual lung screening.         Follow up plan: Return in about 4 weeks (around 03/29/2023) for HTN -- increased Valsartan to 80 MG.

## 2023-03-01 NOTE — Assessment & Plan Note (Addendum)
Chronic, uncontrolled, continues to have elevations above goal in office and at home.  Will increase to Valsartan 80 MG at this time and adjust as needed - recommend he take at night and not morning to see if reduce side effects.  He is tolerating this.  Educated him on medication and changes.  Recommend he monitor BP at least a few mornings a week at home and document.  DASH diet at home.  Labs today: BMP.  Return in 4 weeks.

## 2023-03-01 NOTE — Assessment & Plan Note (Signed)
Started 4 days ago.  Flu negative on testing.  Covid test sent, suspect this may be positive.  If returns tomorrow with positive result (day 5) will send in Paxlovid, discussed with him at length.  For now he prefers no prescription medication.  Recommend: - Increased rest - Increasing Fluids - Acetaminophen as needed for fever/pain.  No Ibuprofen due to HTN. - Salt water gargling, chloraseptic spray and throat lozenges - Mucinex.  - Saline sinus flushes or a neti pot.  - Humidifying the air.

## 2023-03-02 ENCOUNTER — Telehealth: Payer: Self-pay

## 2023-03-02 ENCOUNTER — Other Ambulatory Visit: Payer: Self-pay | Admitting: Nurse Practitioner

## 2023-03-02 DIAGNOSIS — E876 Hypokalemia: Secondary | ICD-10-CM

## 2023-03-02 MED ORDER — POTASSIUM CHLORIDE CRYS ER 10 MEQ PO TBCR
10.0000 meq | EXTENDED_RELEASE_TABLET | Freq: Every day | ORAL | 0 refills | Status: DC
Start: 2023-03-02 — End: 2023-03-10

## 2023-03-02 MED ORDER — NIRMATRELVIR/RITONAVIR (PAXLOVID)TABLET: 3.0000 | ORAL_TABLET | Freq: Two times a day (BID) | ORAL | 0 refills | Status: AC

## 2023-03-02 NOTE — Progress Notes (Signed)
Contacted via MyChart -- need lab visit in one week please outpatient, also Brit just call to make sure he checked mychart as am sending in supplement.  Thank you:)  Good afternoon Clint, your labs have returned: - Kidney function, creatinine and eGFR, remains normal. - Potassium level remains low.  I am going to send in supplement for you to start and would like to recheck this in one week.  None of your medications would cause this, in fact Valsartan should help increase level.  So I suspect this is more diet related, try to increase potassium rich foods in diet -- like bananas, nuts, mangoes, potatoes, dried fruit. - Cholesterol levels are trending down, continue Rosuvastatin for prevention.  Any questions? Keep being amazing!!  Thank you for allowing me to participate in your care.  I appreciate you. Kindest regards, Becky Colan

## 2023-03-02 NOTE — Progress Notes (Signed)
Contacted via MyChart   Your Covid did return positive.  I will send in the Paxlovid as we discussed, start this today.  Also quarantine for 5 days since beginning of symptoms and then wear mask for 5 days.  If any worsening or ongoing symptoms let me know.:)

## 2023-03-02 NOTE — Telephone Encounter (Signed)
Pt called for COVID test results. While holding Jolene sent message to pt.  - no need for NT.

## 2023-03-02 NOTE — Addendum Note (Signed)
Addended by: Aura Dials T on: 03/02/2023 12:27 PM   Modules accepted: Orders

## 2023-03-02 NOTE — Progress Notes (Signed)
Called and scheduled on 03/09/2023 @ 8:20 am.

## 2023-03-09 ENCOUNTER — Other Ambulatory Visit: Payer: BC Managed Care – PPO

## 2023-03-09 DIAGNOSIS — E876 Hypokalemia: Secondary | ICD-10-CM

## 2023-03-10 ENCOUNTER — Other Ambulatory Visit: Payer: Self-pay | Admitting: Nurse Practitioner

## 2023-03-10 DIAGNOSIS — E876 Hypokalemia: Secondary | ICD-10-CM

## 2023-03-10 LAB — POTASSIUM: Potassium: 3.4 mmol/L — ABNORMAL LOW (ref 3.5–5.2)

## 2023-03-10 MED ORDER — POTASSIUM CHLORIDE CRYS ER 10 MEQ PO TBCR
10.0000 meq | EXTENDED_RELEASE_TABLET | Freq: Every day | ORAL | 0 refills | Status: DC
Start: 2023-03-10 — End: 2023-03-26

## 2023-03-10 NOTE — Progress Notes (Signed)
Contacted via MyChart -- needs lab only visit in 7 days please   Good morning Clint, your potassium has returned and is trending up, but still a little low.  Lets do 7 more days of potassium and focus heavily on potassium rich foods in diet as well: bananas, mangoes, potatoes, nuts, dried fruit, milk -- lots of things.  We will recheck in 7 days again.  Any questions? Keep being stellar!!  Thank you for allowing me to participate in your care.  I appreciate you. Kindest regards, Vaness Jelinski

## 2023-03-10 NOTE — Progress Notes (Signed)
Attempted to reach patient, LVM to call office back to schedule 7 days lab only appointment.  Put in CRM.

## 2023-03-22 ENCOUNTER — Other Ambulatory Visit: Payer: BC Managed Care – PPO

## 2023-03-26 NOTE — Patient Instructions (Signed)
Be Involved in Caring For Your Health:  Taking Medications When medications are taken as directed, they can greatly improve your health. But if they are not taken as prescribed, they may not work. In some cases, not taking them correctly can be harmful. To help ensure your treatment remains effective and safe, understand your medications and how to take them. Bring your medications to each visit for review by your provider.  Your lab results, notes, and after visit summary will be available on My Chart. We strongly encourage you to use this feature. If lab results are abnormal the clinic will contact you with the appropriate steps. If the clinic does not contact you assume the results are satisfactory. You can always view your results on My Chart. If you have questions regarding your health or results, please contact the clinic during office hours. You can also ask questions on My Chart.  We at [facility] are grateful that you chose us to provide your care. We strive to provide evidence-based and compassionate care and are always looking for feedback. If you get a survey from the clinic please complete this so we can hear your opinions.  DASH Eating Plan DASH stands for Dietary Approaches to Stop Hypertension. The DASH eating plan is a healthy eating plan that has been shown to: Lower high blood pressure (hypertension). Reduce your risk for type 2 diabetes, heart disease, and stroke. Help with weight loss. What are tips for following this plan? Reading food labels Check food labels for the amount of salt (sodium) per serving. Choose foods with less than 5 percent of the Daily Value (DV) of sodium. In general, foods with less than 300 milligrams (mg) of sodium per serving fit into this eating plan. To find whole grains, look for the word "whole" as the first word in the ingredient list. Shopping Buy products labeled as "low-sodium" or "no salt added." Buy fresh foods. Avoid canned  foods and pre-made or frozen meals. Cooking Try not to add salt when you cook. Use salt-free seasonings or herbs instead of table salt or sea salt. Check with your health care provider or pharmacist before using salt substitutes. Do not fry foods. Cook foods in healthy ways, such as baking, boiling, grilling, roasting, or broiling. Cook using oils that are good for your heart. These include olive, canola, avocado, soybean, and sunflower oil. Meal planning  Eat a balanced diet. This should include: 4 or more servings of fruits and 4 or more servings of vegetables each day. Try to fill half of your plate with fruits and vegetables. 6-8 servings of whole grains each day. 6 or less servings of lean meat, poultry, or fish each day. 1 oz is 1 serving. A 3 oz (85 g) serving of meat is about the same size as the palm of your hand. One egg is 1 oz (28 g). 2-3 servings of low-fat dairy each day. One serving is 1 cup (237 mL). 1 serving of nuts, seeds, or beans 5 times each week. 2-3 servings of heart-healthy fats. Healthy fats called omega-3 fatty acids are found in foods such as walnuts, flaxseeds, fortified milks, and eggs. These fats are also found in cold-water fish, such as sardines, salmon, and mackerel. Limit how much you eat of: Canned or prepackaged foods. Food that is high in trans fat, such as fried foods. Food that is high in saturated fat, such as fatty meat. Desserts and other sweets, sugary drinks, and other foods with added sugar. Full-fat   dairy products. Do not salt foods before eating. Do not eat more than 4 egg yolks a week. Try to eat at least 2 vegetarian meals a week. Eat more home-cooked food and less restaurant, buffet, and fast food. Lifestyle When eating at a restaurant, ask if your food can be made with less salt or no salt. If you drink alcohol: Limit how much you have to: 0-1 drink a day if you are male. 0-2 drinks a day if you are male. Know how much alcohol is in  your drink. In the [place] one drink is one 12 oz bottle of beer (355 mL), one 5 oz glass of wine (148 mL), or one 1 oz glass of hard liquor (44 mL). General information Avoid eating more than 2,300 mg of salt a day. If you have hypertension, you may need to reduce your sodium intake to 1,500 mg a day. Work with your provider to stay at a healthy body weight or lose weight. Ask what the best weight range is for you. On most days of the week, get at least 30 minutes of exercise that causes your heart to beat faster. This may include walking, swimming, or biking. Work with your provider or dietitian to adjust your eating plan to meet your specific calorie needs. What foods should I eat? Fruits All fresh, dried, or frozen fruit. Canned fruits that are in their natural juice and do not have sugar added to them. Vegetables Fresh or frozen vegetables that are raw, steamed, roasted, or grilled. Low-sodium or reduced-sodium tomato and vegetable juice. Low-sodium or reduced-sodium tomato sauce and tomato paste. Low-sodium or reduced-sodium canned vegetables. Grains Whole-grain or whole-wheat bread. Whole-grain or whole-wheat pasta. Brown rice. Oatmeal. Quinoa. Bulgur. Whole-grain and low-sodium cereals. Pita bread. Low-fat, low-sodium crackers. Whole-wheat flour tortillas. Meats and other proteins Skinless chicken or turkey. Ground chicken or turkey. Pork with fat trimmed off. Fish and seafood. Egg whites. Dried beans, peas, or lentils. Unsalted nuts, nut butters, and seeds. Unsalted canned beans. Lean cuts of beef with fat trimmed off. Low-sodium, lean precooked or cured meat, such as sausages or meat loaves. Dairy Low-fat (1%) or fat-free (skim) milk. Reduced-fat, low-fat, or fat-free cheeses. Nonfat, low-sodium ricotta or cottage cheese. Low-fat or nonfat yogurt. Low-fat, low-sodium cheese. Fats and oils Soft margarine without trans fats. Vegetable oil. Reduced-fat, low-fat, or light mayonnaise and salad  dressings (reduced-sodium). Canola, safflower, olive, avocado, soybean, and sunflower oils. Avocado. Seasonings and condiments Herbs. Spices. Seasoning mixes without salt. Other foods Unsalted popcorn and pretzels. Fat-free sweets. The items listed above may not be all the foods and drinks you can have. Talk to a dietitian to learn more. What foods should I avoid? Fruits Canned fruit in a light or heavy syrup. Fried fruit. Fruit in cream or butter sauce. Vegetables Creamed or fried vegetables. Vegetables in a cheese sauce. Regular canned vegetables that are not marked as low-sodium or reduced-sodium. Regular canned tomato sauce and paste that are not marked as low-sodium or reduced-sodium. Regular tomato and vegetable juices that are not marked as low-sodium or reduced-sodium. Pickles. Olives. Grains Baked goods made with fat, such as croissants, muffins, or some breads. Dry pasta or rice meal packs. Meats and other proteins Fatty cuts of meat. Ribs. Fried meat. Bacon. Bologna, salami, and other precooked or cured meats, such as sausages or meat loaves, that are not lean and low in sodium. Fat from the back of a pig (fatback). Bratwurst. Salted nuts and seeds. Canned beans with added salt. Canned   or smoked fish. Whole eggs or egg yolks. Chicken or turkey with skin. Dairy Whole or 2% milk, cream, and half-and-half. Whole or full-fat cream cheese. Whole-fat or sweetened yogurt. Full-fat cheese. Nondairy creamers. Whipped toppings. Processed cheese and cheese spreads. Fats and oils Butter. Stick margarine. Lard. Shortening. Ghee. Bacon fat. Tropical oils, such as coconut, palm kernel, or palm oil. Seasonings and condiments Onion salt, garlic salt, seasoned salt, table salt, and sea salt. Worcestershire sauce. Tartar sauce. Barbecue sauce. Teriyaki sauce. Soy sauce, including reduced-sodium soy sauce. Steak sauce. Canned and packaged gravies. Fish sauce. Oyster sauce. Cocktail sauce. Store-bought  horseradish. Ketchup. Mustard. Meat flavorings and tenderizers. Bouillon cubes. Hot sauces. Pre-made or packaged marinades. Pre-made or packaged taco seasonings. Relishes. Regular salad dressings. Other foods Salted popcorn and pretzels. The items listed above may not be all the foods and drinks you should avoid. Talk to a dietitian to learn more. Where to find more information National Heart, Lung, and Blood Institute (NHLBI): nhlbi.nih.gov American Heart Association (AHA): heart.org Academy of Nutrition and Dietetics: eatright.org National Kidney Foundation (NKF): kidney.org This information is not intended to replace advice given to you by your health care provider. Make sure you discuss any questions you have with your health care provider. Document Revised: 07/28/2022 Document Reviewed: 07/28/2022 Elsevier Patient Education  2024 Elsevier Inc.  

## 2023-03-29 ENCOUNTER — Ambulatory Visit (INDEPENDENT_AMBULATORY_CARE_PROVIDER_SITE_OTHER): Payer: BC Managed Care – PPO | Admitting: Nurse Practitioner

## 2023-03-29 ENCOUNTER — Encounter: Payer: Self-pay | Admitting: Nurse Practitioner

## 2023-03-29 VITALS — BP 158/89 | HR 63 | Temp 98.0°F | Ht 69.0 in | Wt 146.2 lb

## 2023-03-29 DIAGNOSIS — I10 Essential (primary) hypertension: Secondary | ICD-10-CM | POA: Diagnosis not present

## 2023-03-29 MED ORDER — VALSARTAN 160 MG PO TABS
160.0000 mg | ORAL_TABLET | Freq: Every day | ORAL | 3 refills | Status: AC
Start: 2023-03-29 — End: ?

## 2023-03-29 NOTE — Assessment & Plan Note (Signed)
Chronic, uncontrolled, continues to have elevations above goal in office.  Will increase to Valsartan 160 MG at this time and adjust further as needed, having no ADR with this medication.  He is tolerating this.  Educated him on medication and changes.  Recommend he monitor BP at least a few mornings a week at home and document.  DASH diet at home.  Labs today: BMP.  Return in 4 weeks.  Will consider adding on 12.5 MG Hydrochlorothiazide in combo pill with Valsartan if ongoing elevations.

## 2023-03-29 NOTE — Progress Notes (Signed)
BP (!) 158/89 (BP Location: Left Arm, Patient Position: Sitting, Cuff Size: Normal)   Pulse 63   Temp 98 F (36.7 C) (Oral)   Ht 5\' 9"  (1.753 m)   Wt 146 lb 3.2 oz (66.3 kg)   SpO2 98%   BMI 21.59 kg/m    Subjective:    Patient ID: Brian Walker, male    DOB: March 05, 1961, 61 y.o.   MRN: [id number]  HPI: Brian Walker is a 62 y.o. male  Chief Complaint  Patient presents with   Hypertension    4 week f/up   HYPERTENSION without Chronic Kidney Disease Follow-up today for medication changes on 03/01/23 when we increased Valsartan to 80 MG which he is tolerating -- no ADR. Hypertension status: uncontrolled  Satisfied with current treatment? yes Duration of hypertension: chronic BP monitoring frequency:   occasionally BP range:  BP medication side effects:  no Medication compliance: good compliance Aspirin: no Recurrent headaches: no Visual changes: no Palpitations: no Dyspnea: no Chest pain: no Lower extremity edema: no Dizzy/lightheaded: no   Relevant past medical, surgical, family and social history reviewed and updated as indicated. Interim medical history since our last visit reviewed. Allergies and medications reviewed and updated.  Review of Systems  Constitutional:  Negative for activity change, diaphoresis, fatigue and fever.  Respiratory:  Negative for cough, chest tightness, shortness of breath and wheezing.   Cardiovascular:  Negative for chest pain, palpitations and leg swelling.  Gastrointestinal: Negative.   Neurological: Negative.   Psychiatric/Behavioral: Negative.     Per HPI unless specifically indicated above     Objective:    BP (!) 158/89 (BP Location: Left Arm, Patient Position: Sitting, Cuff Size: Normal)   Pulse 63   Temp 98 F (36.7 C) (Oral)   Ht 5\' 9"  (1.753 m)   Wt 146 lb 3.2 oz (66.3 kg)   SpO2 98%   BMI 21.59 kg/m   Wt Readings from Last 3 Encounters:  03/29/23 146 lb 3.2 oz (66.3 kg)  03/01/23 141 lb (64 kg)  01/04/23  145 lb 3.2 oz (65.9 kg)    Physical Exam Vitals and nursing note reviewed.  Constitutional:      General: He is awake. He is not in acute distress.    Appearance: He is well-developed and well-groomed. He is not ill-appearing or toxic-appearing.  HENT:     Head: Normocephalic.  Eyes:     General: Lids are normal.     Extraocular Movements: Extraocular movements intact.     Conjunctiva/sclera: Conjunctivae normal.  Neck:     Thyroid: No thyromegaly.     Vascular: No carotid bruit.  Cardiovascular:     Rate and Rhythm: Normal rate and regular rhythm.     Heart sounds: Normal heart sounds.  Pulmonary:     Effort: No accessory muscle usage or respiratory distress.     Breath sounds: Normal breath sounds.  Abdominal:     General: Bowel sounds are normal. There is no distension.     Palpations: Abdomen is soft.     Tenderness: There is no abdominal tenderness.  Musculoskeletal:     Cervical back: Full passive range of motion without pain.     Right lower leg: No edema.     Left lower leg: No edema.  Lymphadenopathy:     Cervical: No cervical adenopathy.  Skin:    General: Skin is warm.     Capillary Refill: Capillary refill takes less than 2 seconds.  Neurological:  Mental Status: He is alert and oriented to person, place, and time.     Deep Tendon Reflexes: Reflexes are normal and symmetric.     Reflex Scores:      Brachioradialis reflexes are 2+ on the right side and 2+ on the left side.      Patellar reflexes are 2+ on the right side and 2+ on the left side. Psychiatric:        Attention and Perception: Attention normal.        Mood and Affect: Mood normal.        Speech: Speech normal.        Behavior: Behavior normal. Behavior is cooperative.        Thought Content: Thought content normal.    Results for orders placed or performed in visit on 03/09/23  Potassium  Result Value Ref Range   Potassium 3.4 (L) 3.5 - 5.2 mmol/L      Assessment & Plan:   Problem  List Items Addressed This Visit       Cardiovascular and Mediastinum   Essential hypertension - Primary    Chronic, uncontrolled, continues to have elevations above goal in office.  Will increase to Valsartan 160 MG at this time and adjust further as needed, having no ADR with this medication.  He is tolerating this.  Educated him on medication and changes.  Recommend he monitor BP at least a few mornings a week at home and document.  DASH diet at home.  Labs today: BMP.  Return in 4 weeks.  Will consider adding on 12.5 MG Hydrochlorothiazide in combo pill with Valsartan if ongoing elevations.      Relevant Medications   valsartan (DIOVAN) 160 MG tablet   Other Relevant Orders   Basic metabolic panel     Follow up plan: Return in about 4 weeks (around 04/26/2023) for HTN -- changed medication dosing.

## 2023-03-30 ENCOUNTER — Other Ambulatory Visit: Payer: Self-pay | Admitting: Nurse Practitioner

## 2023-03-30 DIAGNOSIS — E876 Hypokalemia: Secondary | ICD-10-CM

## 2023-03-30 LAB — BASIC METABOLIC PANEL
BUN/Creatinine Ratio: 7 — ABNORMAL LOW (ref 10–24)
BUN: 8 mg/dL (ref 8–27)
CO2: 27 mmol/L (ref 20–29)
Calcium: 9.4 mg/dL (ref 8.6–10.2)
Chloride: 99 mmol/L (ref 96–106)
Creatinine, Ser: 1.11 mg/dL (ref 0.76–1.27)
Glucose: 79 mg/dL (ref 70–99)
Potassium: 3.2 mmol/L — ABNORMAL LOW (ref 3.5–5.2)
Sodium: 142 mmol/L (ref 134–144)
eGFR: 74 mL/min/{1.73_m2} (ref >59)

## 2023-03-30 MED ORDER — POTASSIUM CHLORIDE CRYS ER 10 MEQ PO TBCR: 10.0000 meq | EXTENDED_RELEASE_TABLET | Freq: Every day | ORAL | 0 refills | Status: DC

## 2023-03-30 NOTE — Progress Notes (Signed)
Contacted via MyChart  -- need lab only visit in one week please Good afternoon Clint, your labs have returned and kidney function remains stable, but your potassium remains low.  We may need to stay on a daily potassium supplement.  I am sending this in and want to recheck level outpatient in one week please.  Any questions?  Also continue on focusing on potassium rich foods in diet. Keep being amazing!!  Thank you for allowing me to participate in your care.  I appreciate you. Kindest regards, Christiona Siddique

## 2023-04-03 NOTE — Progress Notes (Signed)
Attempted to reach patient, LVM to call office back to get scheduled labs in one week.  Put in CRM.

## 2023-04-23 NOTE — Patient Instructions (Incomplete)
Stop 80 MG Valsartan and take two of your 160 MG tablets until complete and then start the 320 MG tablet + start Spironolactone as ordered.  Be Involved in Caring For Your Health:  Taking Medications When medications are taken as directed, they can greatly improve your health. But if they are not taken as prescribed, they may not work. In some cases, not taking them correctly can be harmful. To help ensure your treatment remains effective and safe, understand your medications and how to take them. Bring your medications to each visit for review by your provider.  Your lab results, notes, and after visit summary will be available on My Chart. We strongly encourage you to use this feature. If lab results are abnormal the clinic will contact you with the appropriate steps. If the clinic does not contact you assume the results are satisfactory. You can always view your results on My Chart. If you have questions regarding your health or results, please contact the clinic during office hours. You can also ask questions on My Chart.  We at [facility] are grateful that you chose [place] to provide your care. We strive to provide evidence-based and compassionate care and are always looking for feedback. If you get a survey from the clinic please complete this so we can hear your opinions.  DASH Eating Plan DASH stands for Dietary Approaches to Stop Hypertension. The DASH eating plan is a healthy eating plan that has been shown to: Lower high blood pressure (hypertension). Reduce your risk for type 2 diabetes, heart disease, and stroke. Help with weight loss. What are tips for following this plan? Reading food labels Check food labels for the amount of salt (sodium) per serving. Choose foods with less than 5 percent of the Daily Value (DV) of sodium. In general, foods with less than 300 milligrams (mg) of sodium per serving fit into this eating plan. To find whole grains, look for the word "whole"  as the first word in the ingredient list. Shopping Buy products labeled as "low-sodium" or "no salt added." Buy fresh foods. Avoid canned foods and pre-made or frozen meals. Cooking Try not to add salt when you cook. Use salt-free seasonings or herbs instead of table salt or sea salt. Check with your health care provider or pharmacist before using salt substitutes. Do not fry foods. Cook foods in healthy ways, such as baking, boiling, grilling, roasting, or broiling. Cook using oils that are good for your heart. These include olive, canola, avocado, soybean, and sunflower oil. Meal planning  Eat a balanced diet. This should include: 4 or more servings of fruits and 4 or more servings of vegetables each day. Try to fill half of your plate with fruits and vegetables. 6-8 servings of whole grains each day. 6 or less servings of lean meat, poultry, or fish each day. 1 oz is 1 serving. A 3 oz (85 g) serving of meat is about the same size as the palm of your hand. One egg is 1 oz (28 g). 2-3 servings of low-fat dairy each day. One serving is 1 cup (237 mL). 1 serving of nuts, seeds, or beans 5 times each week. 2-3 servings of heart-healthy fats. Healthy fats called omega-3 fatty acids are found in foods such as walnuts, flaxseeds, fortified milks, and eggs. These fats are also found in cold-water fish, such as sardines, salmon, and mackerel. Limit how much you eat of: Canned or prepackaged foods. Food that is high in trans fat, such  as fried foods. Food that is high in saturated fat, such as fatty meat. Desserts and other sweets, sugary drinks, and other foods with added sugar. Full-fat dairy products. Do not salt foods before eating. Do not eat more than 4 egg yolks a week. Try to eat at least 2 vegetarian meals a week. Eat more home-cooked food and less restaurant, buffet, and fast food. Lifestyle When eating at a restaurant, ask if your food can be made with less salt or no salt. If you  drink alcohol: Limit how much you have to: 0-1 drink a day if you are male. 0-2 drinks a day if you are male. Know how much alcohol is in your drink. In the [place] one drink is one 12 oz bottle of beer (355 mL), one 5 oz glass of wine (148 mL), or one 1 oz glass of hard liquor (44 mL). General information Avoid eating more than 2,300 mg of salt a day. If you have hypertension, you may need to reduce your sodium intake to 1,500 mg a day. Work with your provider to stay at a healthy body weight or lose weight. Ask what the best weight range is for you. On most days of the week, get at least 30 minutes of exercise that causes your heart to beat faster. This may include walking, swimming, or biking. Work with your provider or dietitian to adjust your eating plan to meet your specific calorie needs. What foods should I eat? Fruits All fresh, dried, or frozen fruit. Canned fruits that are in their natural juice and do not have sugar added to them. Vegetables Fresh or frozen vegetables that are raw, steamed, roasted, or grilled. Low-sodium or reduced-sodium tomato and vegetable juice. Low-sodium or reduced-sodium tomato sauce and tomato paste. Low-sodium or reduced-sodium canned vegetables. Grains Whole-grain or whole-wheat bread. Whole-grain or whole-wheat pasta. Brown rice. Orpah Cobb. Bulgur. Whole-grain and low-sodium cereals. Pita bread. Low-fat, low-sodium crackers. Whole-wheat flour tortillas. Meats and other proteins Skinless chicken or [place]. Ground chicken or [place]. Pork with fat trimmed off. Fish and seafood. Egg whites. Dried beans, peas, or lentils. Unsalted nuts, nut butters, and seeds. Unsalted canned beans. Lean cuts of beef with fat trimmed off. Low-sodium, lean precooked or cured meat, such as sausages or meat loaves. Dairy Low-fat (1%) or fat-free (skim) milk. Reduced-fat, low-fat, or fat-free cheeses. Nonfat, low-sodium ricotta or cottage cheese. Low-fat or nonfat yogurt.  Low-fat, low-sodium cheese. Fats and oils Soft margarine without trans fats. Vegetable oil. Reduced-fat, low-fat, or light mayonnaise and salad dressings (reduced-sodium). Canola, safflower, olive, avocado, soybean, and sunflower oils. Avocado. Seasonings and condiments Herbs. Spices. Seasoning mixes without salt. Other foods Unsalted popcorn and pretzels. Fat-free sweets. The items listed above may not be all the foods and drinks you can have. Talk to a dietitian to learn more. What foods should I avoid? Fruits Canned fruit in a light or heavy syrup. Fried fruit. Fruit in cream or butter sauce. Vegetables Creamed or fried vegetables. Vegetables in a cheese sauce. Regular canned vegetables that are not marked as low-sodium or reduced-sodium. Regular canned tomato sauce and paste that are not marked as low-sodium or reduced-sodium. Regular tomato and vegetable juices that are not marked as low-sodium or reduced-sodium. Rosita Fire. Olives. Grains Baked goods made with fat, such as croissants, muffins, or some breads. Dry pasta or rice meal packs. Meats and other proteins Fatty cuts of meat. Ribs. Fried meat. Tomasa Blase. Bologna, salami, and other precooked or cured meats, such as sausages or meat loaves,  that are not lean and low in sodium. Fat from the back of a pig (fatback). Bratwurst. Salted nuts and seeds. Canned beans with added salt. Canned or smoked fish. Whole eggs or egg yolks. Chicken or [place] with skin. Dairy Whole or 2% milk, cream, and half-and-half. Whole or full-fat cream cheese. Whole-fat or sweetened yogurt. Full-fat cheese. Nondairy creamers. Whipped toppings. Processed cheese and cheese spreads. Fats and oils Butter. Stick margarine. Lard. Shortening. Ghee. Bacon fat. Tropical oils, such as coconut, palm kernel, or palm oil. Seasonings and condiments Onion salt, garlic salt, seasoned salt, table salt, and sea salt. Worcestershire sauce. Tartar sauce. Barbecue sauce. Teriyaki sauce.  Soy sauce, including reduced-sodium soy sauce. Steak sauce. Canned and packaged gravies. Fish sauce. Oyster sauce. Cocktail sauce. Store-bought horseradish. Ketchup. Mustard. Meat flavorings and tenderizers. Bouillon cubes. Hot sauces. Pre-made or packaged marinades. Pre-made or packaged taco seasonings. Relishes. Regular salad dressings. Other foods Salted popcorn and pretzels. The items listed above may not be all the foods and drinks you should avoid. Talk to a dietitian to learn more. Where to find more information National Heart, Lung, and Blood Institute (NHLBI): BuffaloDryCleaner.gl American Heart Association (AHA): heart.org Academy of Nutrition and Dietetics: eatright.org National Kidney Foundation (NKF): kidney.org This information is not intended to replace advice given to you by your health care provider. Make sure you discuss any questions you have with your health care provider. Document Revised: 07/28/2022 Document Reviewed: 07/28/2022 Elsevier Patient Education  2024 [employer].

## 2023-04-26 ENCOUNTER — Encounter: Payer: Self-pay | Admitting: Nurse Practitioner

## 2023-04-26 ENCOUNTER — Ambulatory Visit (INDEPENDENT_AMBULATORY_CARE_PROVIDER_SITE_OTHER): Payer: BC Managed Care – PPO | Admitting: Nurse Practitioner

## 2023-04-26 VITALS — BP 148/90 | HR 70 | Temp 97.8°F | Resp 16 | Ht 69.0 in | Wt 148.2 lb

## 2023-04-26 DIAGNOSIS — M25571 Pain in right ankle and joints of right foot: Secondary | ICD-10-CM | POA: Diagnosis not present

## 2023-04-26 DIAGNOSIS — Z1211 Encounter for screening for malignant neoplasm of colon: Secondary | ICD-10-CM

## 2023-04-26 DIAGNOSIS — E876 Hypokalemia: Secondary | ICD-10-CM | POA: Diagnosis not present

## 2023-04-26 DIAGNOSIS — F17210 Nicotine dependence, cigarettes, uncomplicated: Secondary | ICD-10-CM

## 2023-04-26 DIAGNOSIS — I10 Essential (primary) hypertension: Secondary | ICD-10-CM

## 2023-04-26 MED ORDER — SPIRONOLACTONE 25 MG PO TABS: 25.0000 mg | ORAL_TABLET | Freq: Every day | ORAL | 2 refills | Status: DC

## 2023-04-26 MED ORDER — VALSARTAN 320 MG PO TABS
320.0000 mg | ORAL_TABLET | Freq: Every day | ORAL | 4 refills | Status: DC
Start: 2023-04-26 — End: 2023-06-21

## 2023-04-26 NOTE — Assessment & Plan Note (Signed)
History of fluctuating levels since 2017 with lows occasionally presenting.  Recheck today.  Avoid medications that lower K+ levels.

## 2023-04-26 NOTE — Assessment & Plan Note (Signed)
Appears to have sprained ankle, no suspicion for fracture -- no recent trauma.  Recommend RICE method and to stop Ibuprofen due to HTN.  He will start Tylenol and get ankle compression today.  He is aware if any worsening pain to return to office.

## 2023-04-26 NOTE — Assessment & Plan Note (Signed)
I have recommended complete cessation of tobacco use. I have discussed various options available for assistance with tobacco cessation including over the counter methods (Nicotine gum, patch and lozenges). We also discussed prescription options (Chantix, Nicotine Inhaler / Nasal Spray). The patient is not interested in pursuing any prescription tobacco cessation options at this time.  Continue annual lung screening. 

## 2023-04-26 NOTE — Assessment & Plan Note (Signed)
Chronic, uncontrolled, continues to have elevations above goal in office and at home  Will increase to Valsartan 320 MG at this time and add on Spironolactone 25 MG daily, having no ADR with Valsartan.  He is tolerating well.  Prefer Spironolactone due to his long history of fluctuating low K+ levels, concern for thiazide due to risk for lowering K+ levels.  Educated him on medication and changes.  Recommend he monitor BP at least a few mornings a week at home and document.  DASH diet at home.  Labs today: BMP .  Return in 4 weeks.

## 2023-04-26 NOTE — Progress Notes (Signed)
BP (!) 148/90 (BP Location: Left Arm, Patient Position: Sitting, Cuff Size: Normal)   Pulse 70   Temp 97.8 F (36.6 C) (Oral)   Resp 16   Ht 5\' 9"  (1.753 m) Comment: per patient  Wt 148 lb 3.2 oz (67.2 kg)   SpO2 99%   BMI 21.89 kg/m    Subjective:    Patient ID: Brian Walker, male    DOB: 1961-03-23, 62 y.o.   MRN: [id number]  HPI: Brian Walker is a 62 y.o. male  Chief Complaint  Patient presents with   Follow-up   Foot Swelling    Right/started last night   HYPERTENSION without Chronic Kidney Disease Follow-up today for BP med changes.  Increased Valsartan at last visit to 160 MG and is tolerating this.  Had low K+ on recent labs and provided supplement.  History of low levels intermittently in past, since 2017. He does endorse when his BP is lower he feels a lot better.  Smoking 1/2 PPD or less, trying to quit. Hypertension status: stable  Satisfied with current treatment? yes Duration of hypertension: chronic BP monitoring frequency:  a few times a week BP range: 140-160/82-100 BP medication side effects:  no Medication compliance: good compliance Aspirin: no Recurrent headaches: no Visual changes: no Palpitations: no Dyspnea: no Chest pain: no Lower extremity edema: no Dizzy/lightheaded: no   RIGHT ANKLE PAIN Does endorse some right foot swelling that started last night.  Went to bed last night and around 11 got up to bathroom and felt twinge to foot.  Was on some odd spots yesterday, was on a pallet and stretching far with foot on beam.  Swelling on lateral aspect of right ankle.  Pain is a 9/10, currently 4-5/10.  If tries to pivot back has pain. Duration: days Involved foot: right Mechanism of injury: unknown Location: right lateral ankle Onset: sudden  Severity: 5/10  Quality:  sharp and throbbing Frequency: constant Radiation: no Aggravating factors: weight bearing, walking, and movement  Alleviating factors: Motrin -- took 8 this morning   Status: fluctuating Treatments attempted: Motrin  Relief with NSAIDs?:  moderate Weakness with weight bearing or walking: yes Morning stiffness: no Swelling: yes Redness: no Bruising: no Paresthesias / decreased sensation: no  Fevers:no   Relevant past medical, surgical, family and social history reviewed and updated as indicated. Interim medical history since our last visit reviewed. Allergies and medications reviewed and updated.  Review of Systems  Constitutional:  Negative for activity change, diaphoresis, fatigue and fever.  Respiratory:  Negative for cough, chest tightness, shortness of breath and wheezing.   Cardiovascular:  Negative for chest pain, palpitations and leg swelling.  Gastrointestinal: Negative.   Musculoskeletal:  Positive for arthralgias.  Neurological: Negative.   Psychiatric/Behavioral: Negative.     Per HPI unless specifically indicated above     Objective:    BP (!) 148/90 (BP Location: Left Arm, Patient Position: Sitting, Cuff Size: Normal)   Pulse 70   Temp 97.8 F (36.6 C) (Oral)   Resp 16   Ht 5\' 9"  (1.753 m) Comment: per patient  Wt 148 lb 3.2 oz (67.2 kg)   SpO2 99%   BMI 21.89 kg/m   Wt Readings from Last 3 Encounters:  04/26/23 148 lb 3.2 oz (67.2 kg)  03/29/23 146 lb 3.2 oz (66.3 kg)  03/01/23 141 lb (64 kg)    Physical Exam Vitals and nursing note reviewed.  Constitutional:      General: He is awake.  He is not in acute distress.    Appearance: He is well-developed and well-groomed. He is not ill-appearing or toxic-appearing.  HENT:     Head: Normocephalic.  Eyes:     General: Lids are normal.     Extraocular Movements: Extraocular movements intact.     Conjunctiva/sclera: Conjunctivae normal.  Neck:     Thyroid: No thyromegaly.     Vascular: No carotid bruit.  Cardiovascular:     Rate and Rhythm: Normal rate and regular rhythm.     Heart sounds: Normal heart sounds.  Pulmonary:     Effort: No accessory muscle usage or  respiratory distress.     Breath sounds: Normal breath sounds.  Abdominal:     General: Bowel sounds are normal. There is no distension.     Palpations: Abdomen is soft.     Tenderness: There is no abdominal tenderness.  Musculoskeletal:     Cervical back: Full passive range of motion without pain.     Right lower leg: No edema.     Left lower leg: No edema.     Right ankle: Swelling present. No ecchymosis. Tenderness present over the lateral malleolus. Decreased range of motion. Anterior drawer test negative. Normal pulse.     Left ankle: Normal.     Comments: No warmth, redness, or bruising to ankle.  Lymphadenopathy:     Cervical: No cervical adenopathy.  Skin:    General: Skin is warm.     Capillary Refill: Capillary refill takes less than 2 seconds.  Neurological:     Mental Status: He is alert and oriented to person, place, and time.     Deep Tendon Reflexes: Reflexes are normal and symmetric.     Reflex Scores:      Brachioradialis reflexes are 2+ on the right side and 2+ on the left side.      Patellar reflexes are 2+ on the right side and 2+ on the left side. Psychiatric:        Attention and Perception: Attention normal.        Mood and Affect: Mood normal.        Speech: Speech normal.        Behavior: Behavior normal. Behavior is cooperative.        Thought Content: Thought content normal.     Results for orders placed or performed in visit on 03/29/23  Basic metabolic panel  Result Value Ref Range   Glucose 79 70 - 99 mg/dL   BUN 8 8 - 27 mg/dL   Creatinine, Ser 1.61 0.76 - 1.27 mg/dL   eGFR 74 >09 UE/AVW/0.98   BUN/Creatinine Ratio 7 (L) 10 - 24   Sodium 142 134 - 144 mmol/L   Potassium 3.2 (L) 3.5 - 5.2 mmol/L   Chloride 99 96 - 106 mmol/L   CO2 27 20 - 29 mmol/L   Calcium 9.4 8.6 - 10.2 mg/dL      Assessment & Plan:   Problem List Items Addressed This Visit       Cardiovascular and Mediastinum   Essential hypertension - Primary    Chronic,  uncontrolled, continues to have elevations above goal in office and at home  Will increase to Valsartan 320 MG at this time and add on Spironolactone 25 MG daily, having no ADR with Valsartan.  He is tolerating well.  Prefer Spironolactone due to his long history of fluctuating low K+ levels, concern for thiazide due to risk for lowering K+ levels.  Educated  him on medication and changes.  Recommend he monitor BP at least a few mornings a week at home and document.  DASH diet at home.  Labs today: BMP .  Return in 4 weeks.        Relevant Medications   valsartan (DIOVAN) 320 MG tablet   spironolactone (ALDACTONE) 25 MG tablet   Other Relevant Orders   Basic metabolic panel     Other   Ankle pain, right    Appears to have sprained ankle, no suspicion for fracture -- no recent trauma.  Recommend RICE method and to stop Ibuprofen due to HTN.  He will start Tylenol and get ankle compression today.  He is aware if any worsening pain to return to office.      Hypokalemia    History of fluctuating levels since 2017 with lows occasionally presenting.  Recheck today.  Avoid medications that lower K+ levels.      Relevant Orders   Basic metabolic panel   Nicotine dependence, cigarettes, uncomplicated    I have recommended complete cessation of tobacco use. I have discussed various options available for assistance with tobacco cessation including over the counter methods (Nicotine gum, patch and lozenges). We also discussed prescription options (Chantix, Nicotine Inhaler / Nasal Spray). The patient is not interested in pursuing any prescription tobacco cessation options at this time. Continue annual lung screening.       Other Visit Diagnoses     Colon cancer screening       GI referral placed.   Relevant Orders   Ambulatory referral to Gastroenterology        Follow up plan: Return in about 4 weeks (around 05/24/2023) for HTN -- increased Valsartan to 320 MG and started Spironolactone 25  MG daily.

## 2023-04-27 ENCOUNTER — Other Ambulatory Visit: Payer: Self-pay | Admitting: Nurse Practitioner

## 2023-04-27 DIAGNOSIS — E876 Hypokalemia: Secondary | ICD-10-CM

## 2023-04-27 LAB — BASIC METABOLIC PANEL
BUN/Creatinine Ratio: 14 (ref 10–24)
BUN: 14 mg/dL (ref 8–27)
CO2: 28 mmol/L (ref 20–29)
Calcium: 9.6 mg/dL (ref 8.6–10.2)
Chloride: 100 mmol/L (ref 96–106)
Creatinine, Ser: 0.99 mg/dL (ref 0.76–1.27)
Glucose: 84 mg/dL (ref 70–99)
Potassium: 3.2 mmol/L — ABNORMAL LOW (ref 3.5–5.2)
Sodium: 141 mmol/L (ref 134–144)
eGFR: 85 mL/min/{1.73_m2} (ref >59)

## 2023-04-27 NOTE — Progress Notes (Signed)
Contacted via MyChart - outpatient lab only in 2 weeks please   Good afternoon Brian Walker, your labs have returned.  Kidney function looks great, but that potassium is still low.  Lets see what Spironolactone can do along with your Valsartan, both hold in potassium and may help levels.  Also get lots of potassium in your diet at home - mangoes, bananas, raisins, dried fruit, orange juice, potatoes. I do not want to give supplement right now since we just increased Valsartan and started Spironolactone, so will plan on recheck outpatient in two weeks.  Staff will call.  Any questions? Keep being amazing!!  Thank you for allowing me to participate in your care.  I appreciate you. Kindest regards, Aamani Moose

## 2023-05-09 ENCOUNTER — Encounter: Payer: Self-pay | Admitting: *Deleted

## 2023-05-21 NOTE — Patient Instructions (Signed)
Be Involved in Caring For Your Health:  Taking Medications When medications are taken as directed, they can greatly improve your health. But if they are not taken as prescribed, they may not work. In some cases, not taking them correctly can be harmful. To help ensure your treatment remains effective and safe, understand your medications and how to take them. Bring your medications to each visit for review by your provider.  Your lab results, notes, and after visit summary will be available on My Chart. We strongly encourage you to use this feature. If lab results are abnormal the clinic will contact you with the appropriate steps. If the clinic does not contact you assume the results are satisfactory. You can always view your results on My Chart. If you have questions regarding your health or results, please contact the clinic during office hours. You can also ask questions on My Chart.  We at [facility] are grateful that you chose us to provide your care. We strive to provide evidence-based and compassionate care and are always looking for feedback. If you get a survey from the clinic please complete this so we can hear your opinions.  DASH Eating Plan DASH stands for Dietary Approaches to Stop Hypertension. The DASH eating plan is a healthy eating plan that has been shown to: Lower high blood pressure (hypertension). Reduce your risk for type 2 diabetes, heart disease, and stroke. Help with weight loss. What are tips for following this plan? Reading food labels Check food labels for the amount of salt (sodium) per serving. Choose foods with less than 5 percent of the Daily Value (DV) of sodium. In general, foods with less than 300 milligrams (mg) of sodium per serving fit into this eating plan. To find whole grains, look for the word "whole" as the first word in the ingredient list. Shopping Buy products labeled as "low-sodium" or "no salt added." Buy fresh foods. Avoid canned  foods and pre-made or frozen meals. Cooking Try not to add salt when you cook. Use salt-free seasonings or herbs instead of table salt or sea salt. Check with your health care provider or pharmacist before using salt substitutes. Do not fry foods. Cook foods in healthy ways, such as baking, boiling, grilling, roasting, or broiling. Cook using oils that are good for your heart. These include olive, canola, avocado, soybean, and sunflower oil. Meal planning  Eat a balanced diet. This should include: 4 or more servings of fruits and 4 or more servings of vegetables each day. Try to fill half of your plate with fruits and vegetables. 6-8 servings of whole grains each day. 6 or less servings of lean meat, poultry, or fish each day. 1 oz is 1 serving. A 3 oz (85 g) serving of meat is about the same size as the palm of your hand. One egg is 1 oz (28 g). 2-3 servings of low-fat dairy each day. One serving is 1 cup (237 mL). 1 serving of nuts, seeds, or beans 5 times each week. 2-3 servings of heart-healthy fats. Healthy fats called omega-3 fatty acids are found in foods such as walnuts, flaxseeds, fortified milks, and eggs. These fats are also found in cold-water fish, such as sardines, salmon, and mackerel. Limit how much you eat of: Canned or prepackaged foods. Food that is high in trans fat, such as fried foods. Food that is high in saturated fat, such as fatty meat. Desserts and other sweets, sugary drinks, and other foods with added sugar. Full-fat   dairy products. Do not salt foods before eating. Do not eat more than 4 egg yolks a week. Try to eat at least 2 vegetarian meals a week. Eat more home-cooked food and less restaurant, buffet, and fast food. Lifestyle When eating at a restaurant, ask if your food can be made with less salt or no salt. If you drink alcohol: Limit how much you have to: 0-1 drink a day if you are male. 0-2 drinks a day if you are male. Know how much alcohol is in  your drink. In the [place] one drink is one 12 oz bottle of beer (355 mL), one 5 oz glass of wine (148 mL), or one 1 oz glass of hard liquor (44 mL). General information Avoid eating more than 2,300 mg of salt a day. If you have hypertension, you may need to reduce your sodium intake to 1,500 mg a day. Work with your provider to stay at a healthy body weight or lose weight. Ask what the best weight range is for you. On most days of the week, get at least 30 minutes of exercise that causes your heart to beat faster. This may include walking, swimming, or biking. Work with your provider or dietitian to adjust your eating plan to meet your specific calorie needs. What foods should I eat? Fruits All fresh, dried, or frozen fruit. Canned fruits that are in their natural juice and do not have sugar added to them. Vegetables Fresh or frozen vegetables that are raw, steamed, roasted, or grilled. Low-sodium or reduced-sodium tomato and vegetable juice. Low-sodium or reduced-sodium tomato sauce and tomato paste. Low-sodium or reduced-sodium canned vegetables. Grains Whole-grain or whole-wheat bread. Whole-grain or whole-wheat pasta. Brown rice. Oatmeal. Quinoa. Bulgur. Whole-grain and low-sodium cereals. Pita bread. Low-fat, low-sodium crackers. Whole-wheat flour tortillas. Meats and other proteins Skinless chicken or turkey. Ground chicken or turkey. Pork with fat trimmed off. Fish and seafood. Egg whites. Dried beans, peas, or lentils. Unsalted nuts, nut butters, and seeds. Unsalted canned beans. Lean cuts of beef with fat trimmed off. Low-sodium, lean precooked or cured meat, such as sausages or meat loaves. Dairy Low-fat (1%) or fat-free (skim) milk. Reduced-fat, low-fat, or fat-free cheeses. Nonfat, low-sodium ricotta or cottage cheese. Low-fat or nonfat yogurt. Low-fat, low-sodium cheese. Fats and oils Soft margarine without trans fats. Vegetable oil. Reduced-fat, low-fat, or light mayonnaise and salad  dressings (reduced-sodium). Canola, safflower, olive, avocado, soybean, and sunflower oils. Avocado. Seasonings and condiments Herbs. Spices. Seasoning mixes without salt. Other foods Unsalted popcorn and pretzels. Fat-free sweets. The items listed above may not be all the foods and drinks you can have. Talk to a dietitian to learn more. What foods should I avoid? Fruits Canned fruit in a light or heavy syrup. Fried fruit. Fruit in cream or butter sauce. Vegetables Creamed or fried vegetables. Vegetables in a cheese sauce. Regular canned vegetables that are not marked as low-sodium or reduced-sodium. Regular canned tomato sauce and paste that are not marked as low-sodium or reduced-sodium. Regular tomato and vegetable juices that are not marked as low-sodium or reduced-sodium. Pickles. Olives. Grains Baked goods made with fat, such as croissants, muffins, or some breads. Dry pasta or rice meal packs. Meats and other proteins Fatty cuts of meat. Ribs. Fried meat. Bacon. Bologna, salami, and other precooked or cured meats, such as sausages or meat loaves, that are not lean and low in sodium. Fat from the back of a pig (fatback). Bratwurst. Salted nuts and seeds. Canned beans with added salt. Canned   or smoked fish. Whole eggs or egg yolks. Chicken or turkey with skin. Dairy Whole or 2% milk, cream, and half-and-half. Whole or full-fat cream cheese. Whole-fat or sweetened yogurt. Full-fat cheese. Nondairy creamers. Whipped toppings. Processed cheese and cheese spreads. Fats and oils Butter. Stick margarine. Lard. Shortening. Ghee. Bacon fat. Tropical oils, such as coconut, palm kernel, or palm oil. Seasonings and condiments Onion salt, garlic salt, seasoned salt, table salt, and sea salt. Worcestershire sauce. Tartar sauce. Barbecue sauce. Teriyaki sauce. Soy sauce, including reduced-sodium soy sauce. Steak sauce. Canned and packaged gravies. Fish sauce. Oyster sauce. Cocktail sauce. Store-bought  horseradish. Ketchup. Mustard. Meat flavorings and tenderizers. Bouillon cubes. Hot sauces. Pre-made or packaged marinades. Pre-made or packaged taco seasonings. Relishes. Regular salad dressings. Other foods Salted popcorn and pretzels. The items listed above may not be all the foods and drinks you should avoid. Talk to a dietitian to learn more. Where to find more information National Heart, Lung, and Blood Institute (NHLBI): nhlbi.nih.gov American Heart Association (AHA): heart.org Academy of Nutrition and Dietetics: eatright.org National Kidney Foundation (NKF): kidney.org This information is not intended to replace advice given to you by your health care provider. Make sure you discuss any questions you have with your health care provider. Document Revised: 07/28/2022 Document Reviewed: 07/28/2022 Elsevier Patient Education  2024 Elsevier Inc.  

## 2023-05-24 ENCOUNTER — Encounter: Payer: Self-pay | Admitting: Nurse Practitioner

## 2023-05-24 ENCOUNTER — Ambulatory Visit (INDEPENDENT_AMBULATORY_CARE_PROVIDER_SITE_OTHER): Payer: BC Managed Care – PPO | Admitting: Nurse Practitioner

## 2023-05-24 VITALS — BP 148/88 | HR 91 | Temp 97.8°F | Ht 69.5 in | Wt 144.8 lb

## 2023-05-24 DIAGNOSIS — I10 Essential (primary) hypertension: Secondary | ICD-10-CM

## 2023-05-24 MED ORDER — NIFEDIPINE ER OSMOTIC RELEASE 30 MG PO TB24
30.0000 mg | ORAL_TABLET | Freq: Every day | ORAL | 1 refills | Status: DC
Start: 2023-05-24 — End: 2023-07-04

## 2023-05-24 NOTE — Progress Notes (Signed)
BP (!) 148/88 (BP Location: Left Arm, Patient Position: Sitting, Cuff Size: Normal)   Pulse 91   Temp 97.8 F (36.6 C) (Oral)   Ht 5' 9.5" (1.765 m)   Wt 144 lb 12.8 oz (65.7 kg)   SpO2 99%   BMI 21.08 kg/m    Subjective:    Patient ID: Brian Walker, male    DOB: 29-Oct-1960, 62 y.o.   MRN: [id number]  HPI: Brian Walker is a 62 y.o. male  Chief Complaint  Patient presents with   Hypertension    4 week follow up on HTN, would like to discuss if he is indeed taking losartan or valsartan   HYPERTENSION without Chronic Kidney Disease Taking Valsartan 320 MG daily and Spironolactone 25 MG daily (added on 04/26/23).  He is tolerating Spironolactone well.  Has had low K+ past labs.  Accidentally took Losartan this morning, did not tolerate this in past. Amlodipine tried in past, caused fatigue.  He has not been taking Ibuprofen.  Has a lot of stressors at home and unsure if he will still live there.  Smoking 1/2 PPD or less, he is trying to quit. Hypertension status: uncontrolled  Satisfied with current treatment? yes Duration of hypertension: chronic BP monitoring frequency:  weekly BP range: 140 - 150/90 range at home BP medication side effects:  no Medication compliance: good compliance Previous BP meds: Losartan Aspirin: no Recurrent headaches: no Visual changes: no Palpitations: no Dyspnea: no Chest pain: no Lower extremity edema: no Dizzy/lightheaded: no   Relevant past medical, surgical, family and social history reviewed and updated as indicated. Interim medical history since our last visit reviewed. Allergies and medications reviewed and updated.  Review of Systems  Constitutional:  Negative for activity change, diaphoresis, fatigue and fever.  Respiratory:  Negative for cough, chest tightness, shortness of breath and wheezing.   Cardiovascular:  Negative for chest pain, palpitations and leg swelling.  Gastrointestinal: Negative.   Neurological: Negative.    Psychiatric/Behavioral: Negative.     Per HPI unless specifically indicated above     Objective:    BP (!) 148/88 (BP Location: Left Arm, Patient Position: Sitting, Cuff Size: Normal)   Pulse 91   Temp 97.8 F (36.6 C) (Oral)   Ht 5' 9.5" (1.765 m)   Wt 144 lb 12.8 oz (65.7 kg)   SpO2 99%   BMI 21.08 kg/m   Wt Readings from Last 3 Encounters:  05/24/23 144 lb 12.8 oz (65.7 kg)  04/26/23 148 lb 3.2 oz (67.2 kg)  03/29/23 146 lb 3.2 oz (66.3 kg)    Physical Exam Vitals and nursing note reviewed.  Constitutional:      General: He is awake. He is not in acute distress.    Appearance: He is well-developed and well-groomed. He is not ill-appearing or toxic-appearing.  HENT:     Head: Normocephalic.  Eyes:     General: Lids are normal.     Extraocular Movements: Extraocular movements intact.     Conjunctiva/sclera: Conjunctivae normal.  Neck:     Thyroid: No thyromegaly.     Vascular: No carotid bruit.  Cardiovascular:     Rate and Rhythm: Normal rate and regular rhythm.     Heart sounds: Normal heart sounds.  Pulmonary:     Effort: No accessory muscle usage or respiratory distress.     Breath sounds: Normal breath sounds.  Abdominal:     General: Bowel sounds are normal. There is no distension.  Palpations: Abdomen is soft.     Tenderness: There is no abdominal tenderness.  Musculoskeletal:     Cervical back: Full passive range of motion without pain.     Right lower leg: No edema.     Left lower leg: No edema.  Lymphadenopathy:     Cervical: No cervical adenopathy.  Skin:    General: Skin is warm.     Capillary Refill: Capillary refill takes less than 2 seconds.  Neurological:     Mental Status: He is alert and oriented to person, place, and time.     Deep Tendon Reflexes: Reflexes are normal and symmetric.     Reflex Scores:      Brachioradialis reflexes are 2+ on the right side and 2+ on the left side.      Patellar reflexes are 2+ on the right side and 2+  on the left side. Psychiatric:        Attention and Perception: Attention normal.        Mood and Affect: Mood normal.        Speech: Speech normal.        Behavior: Behavior normal. Behavior is cooperative.        Thought Content: Thought content normal.    Results for orders placed or performed in visit on 04/26/23  Basic metabolic panel  Result Value Ref Range   Glucose 84 70 - 99 mg/dL   BUN 14 8 - 27 mg/dL   Creatinine, Ser 1.61 0.76 - 1.27 mg/dL   eGFR 85 >09 UE/AVW/0.98   BUN/Creatinine Ratio 14 10 - 24   Sodium 141 134 - 144 mmol/L   Potassium 3.2 (L) 3.5 - 5.2 mmol/L   Chloride 100 96 - 106 mmol/L   CO2 28 20 - 29 mmol/L   Calcium 9.6 8.6 - 10.2 mg/dL      Assessment & Plan:   Problem List Items Addressed This Visit       Cardiovascular and Mediastinum   Essential hypertension - Primary    Chronic, uncontrolled, continues to have elevations above goal in office and at home  Will continue Valsartan 320 MG and Spironolactone 25 MG daily, having no ADR with either. Prefer Spironolactone due to his long history of fluctuating low K+ levels, concern for thiazide due to risk for lowering K+ levels.  He had fatigue with Amlodipine in past, but ?whether this is related more to home stressors.  Discussed next options with him and educated on how they work and side effects: BB vs trial of Nifedipine.  He would prefer to try the Nifedipine.  Will start XL dosing at 30 MG and see if ADR presents, he will alert provider.  Educated him on medication and changes.  Recommend he monitor BP at least a few mornings a week at home and document.  DASH diet at home.  Labs today: BMP .  Return in 4 weeks.        Relevant Medications   NIFEdipine (PROCARDIA-XL/NIFEDICAL-XL) 30 MG 24 hr tablet   Other Relevant Orders   Basic metabolic panel     Follow up plan: Return in about 4 weeks (around 06/21/2023) for HTN.

## 2023-05-24 NOTE — Assessment & Plan Note (Signed)
Chronic, uncontrolled, continues to have elevations above goal in office and at home  Will continue Valsartan 320 MG and Spironolactone 25 MG daily, having no ADR with either. Prefer Spironolactone due to his long history of fluctuating low K+ levels, concern for thiazide due to risk for lowering K+ levels.  He had fatigue with Amlodipine in past, but ?whether this is related more to home stressors.  Discussed next options with him and educated on how they work and side effects: BB vs trial of Nifedipine.  He would prefer to try the Nifedipine.  Will start XL dosing at 30 MG and see if ADR presents, he will alert provider.  Educated him on medication and changes.  Recommend he monitor BP at least a few mornings a week at home and document.  DASH diet at home.  Labs today: BMP .  Return in 4 weeks.

## 2023-05-25 ENCOUNTER — Other Ambulatory Visit: Payer: Self-pay | Admitting: Nurse Practitioner

## 2023-05-25 DIAGNOSIS — E876 Hypokalemia: Secondary | ICD-10-CM

## 2023-05-25 LAB — BASIC METABOLIC PANEL
BUN/Creatinine Ratio: 10 (ref 10–24)
BUN: 10 mg/dL (ref 8–27)
CO2: 25 mmol/L (ref 20–29)
Calcium: 9.4 mg/dL (ref 8.6–10.2)
Chloride: 100 mmol/L (ref 96–106)
Creatinine, Ser: 1.05 mg/dL (ref 0.76–1.27)
Glucose: 147 mg/dL — ABNORMAL HIGH (ref 70–99)
Potassium: 2.9 mmol/L — ABNORMAL LOW (ref 3.5–5.2)
Sodium: 140 mmol/L (ref 134–144)
eGFR: 79 mL/min/{1.73_m2} (ref >59)

## 2023-05-25 MED ORDER — POTASSIUM CHLORIDE CRYS ER 10 MEQ PO TBCR: 10.0000 meq | EXTENDED_RELEASE_TABLET | Freq: Every day | ORAL | 0 refills | Status: DC

## 2023-05-25 NOTE — Progress Notes (Signed)
Contacted via MyChart - needs labs visit next week please (one week)   Good morning Clint, your labs have returned: - Kidney function, creatinine and eGFR, remains normal. - Glucose elevated, had you ate before these labs? - Your potassium is still low, even with two medications that often push potassium up.  I am going to send in supplement for your to take for 5 days and then recheck.  Please add potassium rich foods to diet.  Any questions? Keep being awesome!!  Thank you for allowing me to participate in your care.  I appreciate you. Kindest regards, Tiyah Zelenak

## 2023-06-06 ENCOUNTER — Other Ambulatory Visit: Payer: Self-pay | Admitting: Nurse Practitioner

## 2023-06-07 NOTE — Telephone Encounter (Signed)
Dose was increased and new rx sent on 04/26/23 #90/4  Requested Prescriptions  Pending Prescriptions Disp Refills   valsartan (DIOVAN) 80 MG tablet [Pharmacy Med Name: VALSARTAN 80 MG TABLET] 90 tablet 1    Sig: TAKE 1 TABLET BY MOUTH EVERY DAY     Cardiovascular:  Angiotensin Receptor Blockers Failed - 06/06/2023  1:33 AM      Failed - K in normal range and within 180 days    Potassium  Date Value Ref Range Status  05/24/2023 2.9 (L) 3.5 - 5.2 mmol/L Final         Failed - Last BP in normal range    BP Readings from Last 1 Encounters:  05/24/23 (!) 148/88         Passed - Cr in normal range and within 180 days    Creatinine, Ser  Date Value Ref Range Status  05/24/2023 1.05 0.76 - 1.27 mg/dL Final         Passed - Patient is not pregnant      Passed - Valid encounter within last 6 months    Recent Outpatient Visits           2 weeks ago Essential hypertension   [place] [facility] [place], Corrie Dandy T, NP   1 month ago Essential hypertension   [place] [facility] [place], Rice Tract[place] T, NP   2 months ago Essential hypertension   [place] [facility] [place], [place] T, NP   3 months ago Centrilobular emphysema (HCC)   [place] [facility] [place], Corrie Dandy T, NP   5 months ago Essential hypertension   [place] [facility] [place], Dorie Rank, NP       Future Appointments             In 2 weeks [place], Dorie Rank, NP [facility]

## 2023-06-18 NOTE — Patient Instructions (Signed)
Be Involved in Caring For Your Health:  Taking Medications When medications are taken as directed, they can greatly improve your health. But if they are not taken as prescribed, they may not work. In some cases, not taking them correctly can be harmful. To help ensure your treatment remains effective and safe, understand your medications and how to take them. Bring your medications to each visit for review by your provider.  Your lab results, notes, and after visit summary will be available on My Chart. We strongly encourage you to use this feature. If lab results are abnormal the clinic will contact you with the appropriate steps. If the clinic does not contact you assume the results are satisfactory. You can always view your results on My Chart. If you have questions regarding your health or results, please contact the clinic during office hours. You can also ask questions on My Chart.  We at [facility] are grateful that you chose [place] to provide your care. We strive to provide evidence-based and compassionate care and are always looking for feedback. If you get a survey from the clinic please complete this so we can hear your opinions.  DASH Eating Plan DASH stands for Dietary Approaches to Stop Hypertension. The DASH eating plan is a healthy eating plan that has been shown to: Lower high blood pressure (hypertension). Reduce your risk for type 2 diabetes, heart disease, and stroke. Help with weight loss. What are tips for following this plan? Reading food labels Check food labels for the amount of salt (sodium) per serving. Choose foods with less than 5 percent of the Daily Value (DV) of sodium. In general, foods with less than 300 milligrams (mg) of sodium per serving fit into this eating plan. To find whole grains, look for the word "whole" as the first word in the ingredient list. Shopping Buy products labeled as "low-sodium" or "no salt added." Buy fresh foods. Avoid canned  foods and pre-made or frozen meals. Cooking Try not to add salt when you cook. Use salt-free seasonings or herbs instead of table salt or sea salt. Check with your health care provider or pharmacist before using salt substitutes. Do not fry foods. Cook foods in healthy ways, such as baking, boiling, grilling, roasting, or broiling. Cook using oils that are good for your heart. These include olive, canola, avocado, soybean, and sunflower oil. Meal planning  Eat a balanced diet. This should include: 4 or more servings of fruits and 4 or more servings of vegetables each day. Try to fill half of your plate with fruits and vegetables. 6-8 servings of whole grains each day. 6 or less servings of lean meat, poultry, or fish each day. 1 oz is 1 serving. A 3 oz (85 g) serving of meat is about the same size as the palm of your hand. One egg is 1 oz (28 g). 2-3 servings of low-fat dairy each day. One serving is 1 cup (237 mL). 1 serving of nuts, seeds, or beans 5 times each week. 2-3 servings of heart-healthy fats. Healthy fats called omega-3 fatty acids are found in foods such as walnuts, flaxseeds, fortified milks, and eggs. These fats are also found in cold-water fish, such as sardines, salmon, and mackerel. Limit how much you eat of: Canned or prepackaged foods. Food that is high in trans fat, such as fried foods. Food that is high in saturated fat, such as fatty meat. Desserts and other sweets, sugary drinks, and other foods with added sugar. Full-fat  dairy products. Do not salt foods before eating. Do not eat more than 4 egg yolks a week. Try to eat at least 2 vegetarian meals a week. Eat more home-cooked food and less restaurant, buffet, and fast food. Lifestyle When eating at a restaurant, ask if your food can be made with less salt or no salt. If you drink alcohol: Limit how much you have to: 0-1 drink a day if you are male. 0-2 drinks a day if you are male. Know how much alcohol is in  your drink. In the [place] one drink is one 12 oz bottle of beer (355 mL), one 5 oz glass of wine (148 mL), or one 1 oz glass of hard liquor (44 mL). General information Avoid eating more than 2,300 mg of salt a day. If you have hypertension, you may need to reduce your sodium intake to 1,500 mg a day. Work with your provider to stay at a healthy body weight or lose weight. Ask what the best weight range is for you. On most days of the week, get at least 30 minutes of exercise that causes your heart to beat faster. This may include walking, swimming, or biking. Work with your provider or dietitian to adjust your eating plan to meet your specific calorie needs. What foods should I eat? Fruits All fresh, dried, or frozen fruit. Canned fruits that are in their natural juice and do not have sugar added to them. Vegetables Fresh or frozen vegetables that are raw, steamed, roasted, or grilled. Low-sodium or reduced-sodium tomato and vegetable juice. Low-sodium or reduced-sodium tomato sauce and tomato paste. Low-sodium or reduced-sodium canned vegetables. Grains Whole-grain or whole-wheat bread. Whole-grain or whole-wheat pasta. Brown rice. Orpah Cobb. Bulgur. Whole-grain and low-sodium cereals. Pita bread. Low-fat, low-sodium crackers. Whole-wheat flour tortillas. Meats and other proteins Skinless chicken or [place]. Ground chicken or [place]. Pork with fat trimmed off. Fish and seafood. Egg whites. Dried beans, peas, or lentils. Unsalted nuts, nut butters, and seeds. Unsalted canned beans. Lean cuts of beef with fat trimmed off. Low-sodium, lean precooked or cured meat, such as sausages or meat loaves. Dairy Low-fat (1%) or fat-free (skim) milk. Reduced-fat, low-fat, or fat-free cheeses. Nonfat, low-sodium ricotta or cottage cheese. Low-fat or nonfat yogurt. Low-fat, low-sodium cheese. Fats and oils Soft margarine without trans fats. Vegetable oil. Reduced-fat, low-fat, or light mayonnaise and salad  dressings (reduced-sodium). Canola, safflower, olive, avocado, soybean, and sunflower oils. Avocado. Seasonings and condiments Herbs. Spices. Seasoning mixes without salt. Other foods Unsalted popcorn and pretzels. Fat-free sweets. The items listed above may not be all the foods and drinks you can have. Talk to a dietitian to learn more. What foods should I avoid? Fruits Canned fruit in a light or heavy syrup. Fried fruit. Fruit in cream or butter sauce. Vegetables Creamed or fried vegetables. Vegetables in a cheese sauce. Regular canned vegetables that are not marked as low-sodium or reduced-sodium. Regular canned tomato sauce and paste that are not marked as low-sodium or reduced-sodium. Regular tomato and vegetable juices that are not marked as low-sodium or reduced-sodium. Rosita Fire. Olives. Grains Baked goods made with fat, such as croissants, muffins, or some breads. Dry pasta or rice meal packs. Meats and other proteins Fatty cuts of meat. Ribs. Fried meat. Tomasa Blase. Bologna, salami, and other precooked or cured meats, such as sausages or meat loaves, that are not lean and low in sodium. Fat from the back of a pig (fatback). Bratwurst. Salted nuts and seeds. Canned beans with added salt. Canned  or smoked fish. Whole eggs or egg yolks. Chicken or [place] with skin. Dairy Whole or 2% milk, cream, and half-and-half. Whole or full-fat cream cheese. Whole-fat or sweetened yogurt. Full-fat cheese. Nondairy creamers. Whipped toppings. Processed cheese and cheese spreads. Fats and oils Butter. Stick margarine. Lard. Shortening. Ghee. Bacon fat. Tropical oils, such as coconut, palm kernel, or palm oil. Seasonings and condiments Onion salt, garlic salt, seasoned salt, table salt, and sea salt. Worcestershire sauce. Tartar sauce. Barbecue sauce. Teriyaki sauce. Soy sauce, including reduced-sodium soy sauce. Steak sauce. Canned and packaged gravies. Fish sauce. Oyster sauce. Cocktail sauce. Store-bought  horseradish. Ketchup. Mustard. Meat flavorings and tenderizers. Bouillon cubes. Hot sauces. Pre-made or packaged marinades. Pre-made or packaged taco seasonings. Relishes. Regular salad dressings. Other foods Salted popcorn and pretzels. The items listed above may not be all the foods and drinks you should avoid. Talk to a dietitian to learn more. Where to find more information National Heart, Lung, and Blood Institute (NHLBI): BuffaloDryCleaner.gl American Heart Association (AHA): heart.org Academy of Nutrition and Dietetics: eatright.org National Kidney Foundation (NKF): kidney.org This information is not intended to replace advice given to you by your health care provider. Make sure you discuss any questions you have with your health care provider. Document Revised: 07/28/2022 Document Reviewed: 07/28/2022 Elsevier Patient Education  2024 [employer].

## 2023-06-21 ENCOUNTER — Ambulatory Visit (INDEPENDENT_AMBULATORY_CARE_PROVIDER_SITE_OTHER): Payer: BC Managed Care – PPO | Admitting: Nurse Practitioner

## 2023-06-21 ENCOUNTER — Encounter: Payer: Self-pay | Admitting: Nurse Practitioner

## 2023-06-21 VITALS — BP 164/93 | HR 73 | Temp 98.3°F | Wt 149.2 lb

## 2023-06-21 DIAGNOSIS — I10 Essential (primary) hypertension: Secondary | ICD-10-CM | POA: Diagnosis not present

## 2023-06-21 DIAGNOSIS — E876 Hypokalemia: Secondary | ICD-10-CM | POA: Diagnosis not present

## 2023-06-21 MED ORDER — ROSUVASTATIN CALCIUM 5 MG PO TABS: 5.0000 mg | ORAL_TABLET | Freq: Every day | ORAL | 4 refills | Status: DC

## 2023-06-21 MED ORDER — SPIRONOLACTONE 25 MG PO TABS: 25.0000 mg | ORAL_TABLET | Freq: Every day | ORAL | 2 refills | Status: DC

## 2023-06-21 MED ORDER — VALSARTAN 320 MG PO TABS: 320.0000 mg | ORAL_TABLET | Freq: Every day | ORAL | 4 refills | Status: DC

## 2023-06-21 NOTE — Assessment & Plan Note (Signed)
Chronic, uncontrolled, continues to have elevations above goal in office but has not been taking medications as ordered and has major stressors at home.  Will continue Valsartan 320 MG and Spironolactone 25 MG daily, having no ADR with either -- have placed new orders for these and to alert [place] if pharmacy does not have them. Prefer Spironolactone due to his long history of fluctuating low K+ levels, concern for thiazide due to risk for lowering K+ levels.   - Had fatigue with Amlodipine in past, but ?whether this is related more to home stressors. -Discussed next options with him and educated on how meds work and side effects: BB vs trial of Nifedipine.  He would prefer to try the Nifedipine.  Will start XL dosing at 30 MG next visit if ongoing elevations.  Was to start this last visit and did not.   - Recommend he monitor BP at least a few mornings a week at home and document.   - DASH diet at home.  Labs today: BMP .   - Lengthy discussion with him today about taking medications as ordered and his current high risk for stroke -- offered a SW referral for therapy due to home stressors.  He refuses.

## 2023-06-21 NOTE — Progress Notes (Addendum)
BP (!) 164/93 (BP Location: Left Arm, Patient Position: Sitting)   Pulse 73   Temp 98.3 F (36.8 C) (Oral)   Wt 149 lb 3.2 oz (67.7 kg)   SpO2 97%   BMI 21.72 kg/m    Subjective:    Patient ID: Brian Walker, male    DOB: 12-02-1960, 62 y.o.   MRN: [id number]  HPI: Brian Walker is a 62 y.o. male  Chief Complaint  Patient presents with   Hypertension    4 week f/up- pt states he has been having trouble getting his 320 mg Valsartan from [facility]. States he has been taking 2, 160 mg tablets    Fatigue    Patient states he has been feeling very fatigued and weak for the last few weeks   HYPERTENSION without Chronic Kidney Disease Follow-up for blood pressure today. We started Nifedipine last visit, but he did not start this.  He has not been taking Spironolactone as well.  States [facility] not been filling his [facility], so he has been taking two of his 160 MG tablets he had left.  Has also not been taking Crestor.  Has low K+ at baseline.  He is very stressed with family life, wife is a major stressor.  Does not want to leave home though as they have a 51-year-old they care for.  Has not been feeling good the past two days due to stressors and BP -- feeling fatigued. Has been looking for his own place. Hypertension status: uncontrolled  Satisfied with current treatment? yes Duration of hypertension: chronic BP monitoring frequency:  not checking BP range:  BP medication side effects:  no Medication compliance: good compliance Aspirin: no Recurrent headaches: no Visual changes: no Palpitations: no Dyspnea: no Chest pain: no Lower extremity edema: no Dizzy/lightheaded: no     05/24/2023   10:26 AM 04/26/2023   10:56 AM 03/29/2023    9:45 AM 01/04/2023   10:09 AM 10/25/2022    2:24 PM  Depression screen PHQ 2/9  Decreased Interest 0 0 0 0 0  Down, Depressed, Hopeless 0 0 0 0 0  PHQ - 2 Score 0 0 0 0 0  Altered sleeping 0 0 0 0 0  Tired, decreased energy 0 0 0 0 0  Change in  appetite 0 0 0 0 0  Feeling bad or failure about yourself  0 0 0 0 0  Trouble concentrating 0 0 0 0 0  Moving slowly or fidgety/restless 0 0 0 0 0  Suicidal thoughts 0 0 0 0 0  PHQ-9 Score 0 0 0 0 0  Difficult doing work/chores   Not difficult at all  Not difficult at all       05/24/2023   10:26 AM 04/26/2023   10:57 AM 03/29/2023    9:46 AM 01/04/2023   10:09 AM  GAD 7 : Generalized Anxiety Score  Nervous, Anxious, on Edge 0 0 0 0  Control/stop worrying 0 0 0 0  Worry too much - different things 0 0 0 0  Trouble relaxing 0 0 0 0  Restless 0 0 0 0  Easily annoyed or irritable 0 0 0 0  Afraid - awful might happen 0 0 0 0  Total GAD 7 Score 0 0 0 0  Anxiety Difficulty   Not difficult at all Not difficult at all   Relevant past medical, surgical, family and social history reviewed and updated as indicated. Interim medical history since our last visit  reviewed. Allergies and medications reviewed and updated.  Review of Systems  Constitutional:  Positive for fatigue. Negative for activity change, diaphoresis and fever.  Respiratory:  Negative for cough, chest tightness, shortness of breath and wheezing.   Cardiovascular:  Negative for chest pain, palpitations and leg swelling.  Gastrointestinal: Negative.   Neurological: Negative.   Psychiatric/Behavioral: Negative.      Per HPI unless specifically indicated above     Objective:    BP (!) 164/93 (BP Location: Left Arm, Patient Position: Sitting)   Pulse 73   Temp 98.3 F (36.8 C) (Oral)   Wt 149 lb 3.2 oz (67.7 kg)   SpO2 97%   BMI 21.72 kg/m   Wt Readings from Last 3 Encounters:  06/21/23 149 lb 3.2 oz (67.7 kg)  05/24/23 144 lb 12.8 oz (65.7 kg)  04/26/23 148 lb 3.2 oz (67.2 kg)    Physical Exam Vitals and nursing note reviewed.  Constitutional:      General: He is awake. He is not in acute distress.    Appearance: He is well-developed and well-groomed. He is not ill-appearing or toxic-appearing.  HENT:      Head: Normocephalic.  Eyes:     General: Lids are normal.     Extraocular Movements: Extraocular movements intact.     Conjunctiva/sclera: Conjunctivae normal.  Neck:     Thyroid: No thyromegaly.     Vascular: No carotid bruit.  Cardiovascular:     Rate and Rhythm: Normal rate and regular rhythm.     Heart sounds: Normal heart sounds.  Pulmonary:     Effort: No accessory muscle usage or respiratory distress.     Breath sounds: Normal breath sounds.  Abdominal:     General: Bowel sounds are normal. There is no distension.     Palpations: Abdomen is soft.     Tenderness: There is no abdominal tenderness.  Musculoskeletal:     Cervical back: Full passive range of motion without pain.     Right lower leg: No edema.     Left lower leg: No edema.  Lymphadenopathy:     Cervical: No cervical adenopathy.  Skin:    General: Skin is warm.     Capillary Refill: Capillary refill takes less than 2 seconds.  Neurological:     Mental Status: He is alert and oriented to person, place, and time.     Deep Tendon Reflexes: Reflexes are normal and symmetric.     Reflex Scores:      Brachioradialis reflexes are 2+ on the right side and 2+ on the left side.      Patellar reflexes are 2+ on the right side and 2+ on the left side. Psychiatric:        Attention and Perception: Attention normal.        Mood and Affect: Mood normal.        Speech: Speech normal.        Behavior: Behavior normal. Behavior is cooperative.        Thought Content: Thought content normal.    Results for orders placed or performed in visit on 05/24/23  Basic metabolic panel  Result Value Ref Range   Glucose 147 (H) 70 - 99 mg/dL   BUN 10 8 - 27 mg/dL   Creatinine, Ser 8.29 0.76 - 1.27 mg/dL   eGFR 79 >56 OZ/HYQ/6.57   BUN/Creatinine Ratio 10 10 - 24   Sodium 140 134 - 144 mmol/L   Potassium 2.9 (L) 3.5 -  5.2 mmol/L   Chloride 100 96 - 106 mmol/L   CO2 25 20 - 29 mmol/L   Calcium 9.4 8.6 - 10.2 mg/dL       Assessment & Plan:   Problem List Items Addressed This Visit       Cardiovascular and Mediastinum   Essential hypertension - Primary    Chronic, uncontrolled, continues to have elevations above goal in office but has not been taking medications as ordered and has major stressors at home.  Will continue Valsartan 320 MG and Spironolactone 25 MG daily, having no ADR with either -- have placed new orders for these and to alert [place] if pharmacy does not have them. Prefer Spironolactone due to his long history of fluctuating low K+ levels, concern for thiazide due to risk for lowering K+ levels.   - Had fatigue with Amlodipine in past, but ?whether this is related more to home stressors. -Discussed next options with him and educated on how meds work and side effects: BB vs trial of Nifedipine.  He would prefer to try the Nifedipine.  Will start XL dosing at 30 MG next visit if ongoing elevations.  Was to start this last visit and did not.   - Recommend he monitor BP at least a few mornings a week at home and document.   - DASH diet at home.  Labs today: BMP .   - Lengthy discussion with him today about taking medications as ordered and his current high risk for stroke -- offered a SW referral for therapy due to home stressors.  He refuses.      Relevant Medications   rosuvastatin (CRESTOR) 5 MG tablet   spironolactone (ALDACTONE) 25 MG tablet   valsartan (DIOVAN) 320 MG tablet   Other Relevant Orders   Basic metabolic panel     Other   Hypokalemia    History of fluctuating levels since 2017 with lows occasionally presenting.  Recheck today.  Avoid medications that lower K+ levels.      Relevant Orders   Basic metabolic panel     Follow up plan: Return in about 2 weeks (around 07/05/2023) for HTN.

## 2023-06-21 NOTE — Assessment & Plan Note (Signed)
History of fluctuating levels since 2017 with lows occasionally presenting.  Recheck today.  Avoid medications that lower K+ levels.

## 2023-06-22 LAB — BASIC METABOLIC PANEL
BUN/Creatinine Ratio: 9 — ABNORMAL LOW (ref 10–24)
BUN: 9 mg/dL (ref 8–27)
CO2: 31 mmol/L — ABNORMAL HIGH (ref 20–29)
Calcium: 9.4 mg/dL (ref 8.6–10.2)
Chloride: 100 mmol/L (ref 96–106)
Creatinine, Ser: 0.96 mg/dL (ref 0.76–1.27)
Glucose: 104 mg/dL — ABNORMAL HIGH (ref 70–99)
Potassium: 3.1 mmol/L — ABNORMAL LOW (ref 3.5–5.2)
Sodium: 141 mmol/L (ref 134–144)
eGFR: 88 mL/min/{1.73_m2} (ref >59)

## 2023-06-24 ENCOUNTER — Other Ambulatory Visit: Payer: Self-pay | Admitting: Nurse Practitioner

## 2023-06-24 MED ORDER — POTASSIUM CHLORIDE CRYS ER 10 MEQ PO TBCR: 10.0000 meq | EXTENDED_RELEASE_TABLET | Freq: Every day | ORAL | 0 refills | Status: DC

## 2023-06-24 NOTE — Progress Notes (Signed)
Contacted via MyChart   Good morning Brian Walker, your labs have returned and potassium continues to run low.  I am sending in supplement for you to take every day until you see me again.  Please ensure you are taking your medications every day!!  Especially for blood pressure. Keep being stellar!!  Thank you for allowing me to participate in your care.  I appreciate you. Kindest regards, Midge Momon

## 2023-07-01 NOTE — Patient Instructions (Signed)

## 2023-07-04 ENCOUNTER — Encounter: Payer: Self-pay | Admitting: Nurse Practitioner

## 2023-07-04 ENCOUNTER — Ambulatory Visit (INDEPENDENT_AMBULATORY_CARE_PROVIDER_SITE_OTHER): Payer: BC Managed Care – PPO | Admitting: Nurse Practitioner

## 2023-07-04 VITALS — BP 148/78 | HR 69 | Temp 98.3°F | Ht 69.5 in | Wt 148.8 lb

## 2023-07-04 DIAGNOSIS — E876 Hypokalemia: Secondary | ICD-10-CM | POA: Diagnosis not present

## 2023-07-04 DIAGNOSIS — F17210 Nicotine dependence, cigarettes, uncomplicated: Secondary | ICD-10-CM | POA: Diagnosis not present

## 2023-07-04 DIAGNOSIS — E782 Mixed hyperlipidemia: Secondary | ICD-10-CM | POA: Diagnosis not present

## 2023-07-04 DIAGNOSIS — I10 Essential (primary) hypertension: Secondary | ICD-10-CM

## 2023-07-04 NOTE — Assessment & Plan Note (Signed)
I have recommended complete cessation of tobacco use. I have discussed various options available for assistance with tobacco cessation including over the counter methods (Nicotine gum, patch and lozenges). We also discussed prescription options (Chantix, Nicotine Inhaler / Nasal Spray). The patient is not interested in pursuing any prescription tobacco cessation options at this time. Continue annual lung screening - due next around 02/07/24.

## 2023-07-04 NOTE — Assessment & Plan Note (Signed)
Chronic, ongoing.  Continue current medication regimen and adjust as needed.  Lipid panel up to date. 

## 2023-07-04 NOTE — Assessment & Plan Note (Signed)
History of fluctuating levels since 2017 with lows occasionally presenting.  Recheck today.  Avoid medications that lower K+ levels.  He is not taking supplement as instructed.

## 2023-07-04 NOTE — Progress Notes (Signed)
BP (!) 148/78 (BP Location: Left Arm, Patient Position: Sitting, Cuff Size: Normal)   Pulse 69   Temp 98.3 F (36.8 C) (Oral)   Ht 5' 9.5" (1.765 m)   Wt 148 lb 12.8 oz (67.5 kg)   SpO2 94%   BMI 21.66 kg/m    Subjective:    Patient ID: Brian Walker, male    DOB: 1960-08-19, 62 y.o.   MRN: [id number]  HPI: Brian Walker is a 62 y.o. 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  He reports he is now taking Spironolactone 25 MG daily, Valsartan 320 MG daily, and Rosuvastatin 5 MG daily (reports starting them 2 weeks ago consistently).  He is not taking potassium as instructed and last K+ level was 3.1. Took Amlodipine in past which caused fatigue and ill feeling.  Reports after taking Valsartan for 2 weeks he noticed difference in tinnitus and overall activity level -- is tolerating this well.   Continues to have lots of stressors at home with his wife due to her mental health issues which are not well-controlled. Does not want to leave home though as they have a 37-year-old they care for and does not want to leave her. Has been looking for his own place.  He continues to smoke <1 PPD. Hypertension status: improving  Satisfied with current treatment? yes Duration of hypertension: chronic BP monitoring frequency:  not checking BP range:  BP medication side effects:  no Medication compliance: good compliance -- now taking appropriately, with exception of K+ Aspirin: no Recurrent headaches: no - improved Visual changes: no Palpitations: no Dyspnea: no Chest pain: no Lower extremity edema: no Dizzy/lightheaded: no  The 10-year ASCVD risk score (Arnett DK, et al., 2019) is: 18%   Values used to calculate the score:     Age: 1 years     Sex: Male     Is Non-Hispanic African American: No     Diabetic: No     Tobacco smoker: Yes     Systolic Blood Pressure: 148  mmHg     Is BP treated: Yes     HDL Cholesterol: 63 mg/dL     Total Cholesterol: 157 mg/dL  Relevant past medical, surgical, family and social history reviewed and updated as indicated. Interim medical history since our last visit reviewed. Allergies and medications reviewed and updated.  Review of Systems  Constitutional:  Negative for activity change, diaphoresis, fatigue and fever.  HENT:  Negative for tinnitus.   Respiratory:  Negative for cough, chest tightness, shortness of breath and wheezing.   Cardiovascular:  Negative for chest pain, palpitations and leg swelling.  Gastrointestinal: Negative.   Neurological: Negative.   Psychiatric/Behavioral: Negative.      Per HPI unless specifically indicated above     Objective:    BP (!) 148/78 (BP Location: Left Arm, Patient Position: Sitting, Cuff Size: Normal)   Pulse 69   Temp 98.3 F (36.8 C) (Oral)   Ht 5' 9.5" (1.765 m)   Wt 148 lb 12.8 oz (67.5 kg)   SpO2 94%   BMI 21.66 kg/m   Wt Readings from Last 3 Encounters:  07/04/23 148 lb 12.8 oz (67.5 kg)  06/21/23 149 lb 3.2 oz (67.7 kg)  05/24/23 144 lb 12.8 oz (65.7 kg)    Physical Exam Vitals and nursing note reviewed.  Constitutional:      General: He is  awake. He is not in acute distress.    Appearance: He is well-developed and well-groomed. He is not ill-appearing or toxic-appearing.  HENT:     Head: Normocephalic.  Eyes:     General: Lids are normal.     Extraocular Movements: Extraocular movements intact.     Conjunctiva/sclera: Conjunctivae normal.  Neck:     Thyroid: No thyromegaly.     Vascular: No carotid bruit.  Cardiovascular:     Rate and Rhythm: Normal rate and regular rhythm.     Heart sounds: Normal heart sounds.  Pulmonary:     Effort: No accessory muscle usage or respiratory distress.     Breath sounds: Normal breath sounds.  Abdominal:     General: Bowel sounds are normal. There is no distension.     Palpations: Abdomen is soft.      Tenderness: There is no abdominal tenderness.  Musculoskeletal:     Cervical back: Full passive range of motion without pain.     Right lower leg: No edema.     Left lower leg: No edema.  Lymphadenopathy:     Cervical: No cervical adenopathy.  Skin:    General: Skin is warm.     Capillary Refill: Capillary refill takes less than 2 seconds.  Neurological:     Mental Status: He is alert and oriented to person, place, and time.     Deep Tendon Reflexes: Reflexes are normal and symmetric.     Reflex Scores:      Brachioradialis reflexes are 2+ on the right side and 2+ on the left side.      Patellar reflexes are 2+ on the right side and 2+ on the left side. Psychiatric:        Attention and Perception: Attention normal.        Mood and Affect: Mood normal.        Speech: Speech normal.        Behavior: Behavior normal. Behavior is cooperative.        Thought Content: Thought content normal.     Results for orders placed or performed in visit on 06/21/23  Basic metabolic panel  Result Value Ref Range   Glucose 104 (H) 70 - 99 mg/dL   BUN 9 8 - 27 mg/dL   Creatinine, Ser 8.65 0.76 - 1.27 mg/dL   eGFR 88 >78 IO/NGE/9.52   BUN/Creatinine Ratio 9 (L) 10 - 24   Sodium 141 134 - 144 mmol/L   Potassium 3.1 (L) 3.5 - 5.2 mmol/L   Chloride 100 96 - 106 mmol/L   CO2 31 (H) 20 - 29 mmol/L   Calcium 9.4 8.6 - 10.2 mg/dL      Assessment & Plan:   Problem List Items Addressed This Visit       Cardiovascular and Mediastinum   Essential hypertension - Primary    Chronic, improving with consistently taking medication for 2 weeks but not at goal yet.  Will continue Valsartan 320 MG and Spironolactone 25 MG daily, having no ADR with either. Prefer Spironolactone due to his long history of fluctuating low K+ levels, concern for thiazide due to risk for lowering K+ levels. Had fatigue with Amlodipine in past, but ?whether this is related more to home stressors. -Discussed next options with him  and educated on how meds work and side effects: BB vs trial of Nifedipine.  He would prefer to try the Nifedipine in future if needed and BP remains elevated next visit.  Would start  XL dosing at 30 MG next visit if ongoing elevations.   - Recommend he monitor BP at least a few mornings a week at home and document.   - DASH diet at home.  Labs today: BMP .   - Lengthy discussion with him today about taking medications as ordered and his current high risk for stroke -- offered a SW referral for therapy due to home stressors.  He refuses.      Relevant Medications   spironolactone (ALDACTONE) 25 MG tablet     Other   Hypokalemia    History of fluctuating levels since 2017 with lows occasionally presenting.  Recheck today.  Avoid medications that lower K+ levels.  He is not taking supplement as instructed.      Relevant Orders   Basic metabolic panel   Mixed hyperlipidemia    Chronic, ongoing.  Continue current medication regimen and adjust as needed.  Lipid panel up to date.      Relevant Medications   spironolactone (ALDACTONE) 25 MG tablet   Nicotine dependence, cigarettes, uncomplicated    I have recommended complete cessation of tobacco use. I have discussed various options available for assistance with tobacco cessation including over the counter methods (Nicotine gum, patch and lozenges). We also discussed prescription options (Chantix, Nicotine Inhaler / Nasal Spray). The patient is not interested in pursuing any prescription tobacco cessation options at this time. Continue annual lung screening - due next around 02/07/24.         Follow up plan: Return in about 8 weeks (around 08/29/2023) for HTN/HLD.

## 2023-07-04 NOTE — Assessment & Plan Note (Signed)
Chronic, improving with consistently taking medication for 2 weeks but not at goal yet.  Will continue Valsartan 320 MG and Spironolactone 25 MG daily, having no ADR with either. Prefer Spironolactone due to his long history of fluctuating low K+ levels, concern for thiazide due to risk for lowering K+ levels. Had fatigue with Amlodipine in past, but ?whether this is related more to home stressors. -Discussed next options with him and educated on how meds work and side effects: BB vs trial of Nifedipine.  He would prefer to try the Nifedipine in future if needed and BP remains elevated next visit.  Would start XL dosing at 30 MG next visit if ongoing elevations.   - Recommend he monitor BP at least a few mornings a week at home and document.   - DASH diet at home.  Labs today: BMP .   - Lengthy discussion with him today about taking medications as ordered and his current high risk for stroke -- offered a SW referral for therapy due to home stressors.  He refuses.

## 2023-07-05 ENCOUNTER — Other Ambulatory Visit: Payer: Self-pay | Admitting: Nurse Practitioner

## 2023-07-05 LAB — BASIC METABOLIC PANEL
BUN/Creatinine Ratio: 12 (ref 10–24)
BUN: 12 mg/dL (ref 8–27)
CO2: 28 mmol/L (ref 20–29)
Calcium: 9.5 mg/dL (ref 8.6–10.2)
Chloride: 99 mmol/L (ref 96–106)
Creatinine, Ser: 1.01 mg/dL (ref 0.76–1.27)
Glucose: 81 mg/dL (ref 70–99)
Potassium: 3.3 mmol/L — ABNORMAL LOW (ref 3.5–5.2)
Sodium: 141 mmol/L (ref 134–144)
eGFR: 83 mL/min/{1.73_m2} (ref >59)

## 2023-07-05 MED ORDER — POTASSIUM CHLORIDE CRYS ER 10 MEQ PO TBCR: 10.0000 meq | EXTENDED_RELEASE_TABLET | Freq: Every day | ORAL | 0 refills | Status: DC

## 2023-07-05 NOTE — Progress Notes (Signed)
Contacted via MyChart, but please call and ensure he received message:   Good morning Brian Walker, your labs have returned and potassium remains low.  I am sending in low dose supplement to take until next visit.  Any questions? Keep being amazing!!  Thank you for allowing me to participate in your care.  I appreciate you. Kindest regards, Brian Walker

## 2023-07-14 DIAGNOSIS — R42 Dizziness and giddiness: Secondary | ICD-10-CM | POA: Diagnosis not present

## 2023-07-14 DIAGNOSIS — J392 Other diseases of pharynx: Secondary | ICD-10-CM | POA: Diagnosis not present

## 2023-07-14 DIAGNOSIS — H9201 Otalgia, right ear: Secondary | ICD-10-CM | POA: Diagnosis not present

## 2023-07-14 DIAGNOSIS — H6981 Other specified disorders of Eustachian tube, right ear: Secondary | ICD-10-CM | POA: Diagnosis not present

## 2023-08-23 ENCOUNTER — Other Ambulatory Visit: Payer: Self-pay | Admitting: Nurse Practitioner

## 2023-08-24 NOTE — Telephone Encounter (Signed)
Requested Prescriptions  Refused Prescriptions Disp Refills   NIFEdipine (PROCARDIA-XL/NIFEDICAL-XL) 30 MG 24 hr tablet [Pharmacy Med Name: NIFEDIPINE ER 30 MG TABLET] 90 tablet     Sig: TAKE 1 TABLET BY MOUTH EVERY DAY     Cardiovascular: Calcium Channel Blockers 2 Failed - 08/24/2023  1:54 PM      Failed - Last BP in normal range    BP Readings from Last 1 Encounters:  07/04/23 (!) 148/78         Passed - Last Heart Rate in normal range    Pulse Readings from Last 1 Encounters:  07/04/23 69         Passed - Valid encounter within last 6 months    Recent Outpatient Visits           1 month ago Essential hypertension   [place] [facility] [place], [place] T, NP   2 months ago Essential hypertension   [place] [facility] [place], [place] T, NP   3 months ago Essential hypertension   [place] [facility] [place], [place] T, NP   4 months ago Essential hypertension   [place] [facility] [place], [place] T, NP   4 months ago Essential hypertension   [place] [facility] [place], Dorie Rank, NP       Future Appointments             In 5 days [place], Dorie Rank, NP [facility]

## 2023-08-27 NOTE — Patient Instructions (Addendum)
 Increase Spironolactone  to 37.5 MG (1 and 1/2 tablets) daily.  Continue the rest of your medications as ordered.  Be Involved in Caring For Your Health:  Taking Medications When medications are taken as directed, they can greatly improve your health. But if they are not taken as prescribed, they may not work. In some cases, not taking them correctly can be harmful. To help ensure your treatment remains effective and safe, understand your medications and how to take them. Bring your medications to each visit for review by your provider.  Your lab results, notes, and after visit summary will be available on My Chart. We strongly encourage you to use this feature. If lab results are abnormal the clinic will contact you with the appropriate steps. If the clinic does not contact you assume the results are satisfactory. You can always view your results on My Chart. If you have questions regarding your health or results, please contact the clinic during office hours. You can also ask questions on My Chart.  We at [facility] are grateful that you chose us  to provide your care. We strive to provide evidence-based and compassionate care and are always looking for feedback. If you get a survey from the clinic please complete this so we can hear your opinions.  DASH Eating Plan DASH stands for Dietary Approaches to Stop Hypertension. The DASH eating plan is a healthy eating plan that has been shown to: Lower high blood pressure (hypertension). Reduce your risk for type 2 diabetes, heart disease, and stroke. Help with weight loss. What are tips for following this plan? Reading food labels Check food labels for the amount of salt (sodium) per serving. Choose foods with less than 5 percent of the Daily Value (DV) of sodium. In general, foods with less than 300 milligrams (mg) of sodium per serving fit into this eating plan. To find whole grains, look for the word whole as the first word in the  ingredient list. Shopping Buy products labeled as low-sodium or no salt added. Buy fresh foods. Avoid canned foods and pre-made or frozen meals. Cooking Try not to add salt when you cook. Use salt-free seasonings or herbs instead of table salt or sea salt. Check with your health care provider or pharmacist before using salt substitutes. Do not fry foods. Cook foods in healthy ways, such as baking, boiling, grilling, roasting, or broiling. Cook using oils that are good for your heart. These include olive, canola, avocado, soybean, and sunflower oil. Meal planning  Eat a balanced diet. This should include: 4 or more servings of fruits and 4 or more servings of vegetables each day. Try to fill half of your plate with fruits and vegetables. 6-8 servings of whole grains each day. 6 or less servings of lean meat, poultry, or fish each day. 1 oz is 1 serving. A 3 oz (85 g) serving of meat is about the same size as the palm of your hand. One egg is 1 oz (28 g). 2-3 servings of low-fat dairy each day. One serving is 1 cup (237 mL). 1 serving of nuts, seeds, or beans 5 times each week. 2-3 servings of heart-healthy fats. Healthy fats called omega-3 fatty acids are found in foods such as walnuts, flaxseeds, fortified milks, and eggs. These fats are also found in cold-water fish, such as sardines, salmon, and mackerel. Limit how much you eat of: Canned or prepackaged foods. Food that is high in trans fat, such as fried foods. Food that is high  in saturated fat, such as fatty meat. Desserts and other sweets, sugary drinks, and other foods with added sugar. Full-fat dairy products. Do not salt foods before eating. Do not eat more than 4 egg yolks a week. Try to eat at least 2 vegetarian meals a week. Eat more home-cooked food and less restaurant, buffet, and fast food. Lifestyle When eating at a restaurant, ask if your food can be made with less salt or no salt. If you drink alcohol: Limit how  much you have to: 0-1 drink a day if you are male. 0-2 drinks a day if you are male. Know how much alcohol is in your drink. In the [place] one drink is one 12 oz bottle of beer (355 mL), one 5 oz glass of wine (148 mL), or one 1 oz glass of hard liquor (44 mL). General information Avoid eating more than 2,300 mg of salt a day. If you have hypertension, you may need to reduce your sodium intake to 1,500 mg a day. Work with your provider to stay at a healthy body weight or lose weight. Ask what the best weight range is for you. On most days of the week, get at least 30 minutes of exercise that causes your heart to beat faster. This may include walking, swimming, or biking. Work with your provider or dietitian to adjust your eating plan to meet your specific calorie needs. What foods should I eat? Fruits All fresh, dried, or frozen fruit. Canned fruits that are in their natural juice and do not have sugar added to them. Vegetables Fresh or frozen vegetables that are raw, steamed, roasted, or grilled. Low-sodium or reduced-sodium tomato and vegetable juice. Low-sodium or reduced-sodium tomato sauce and tomato paste. Low-sodium or reduced-sodium canned vegetables. Grains Whole-grain or whole-wheat bread. Whole-grain or whole-wheat pasta. Brown rice. [place]neil Madeira. Bulgur. Whole-grain and low-sodium cereals. Pita bread. Low-fat, low-sodium crackers. Whole-wheat flour tortillas. Meats and other proteins Skinless chicken or turkey. Ground chicken or turkey. Pork with fat trimmed off. Fish and seafood. Egg whites. Dried beans, peas, or lentils. Unsalted nuts, nut butters, and seeds. Unsalted canned beans. Lean cuts of beef with fat trimmed off. Low-sodium, lean precooked or cured meat, such as sausages or meat loaves. Dairy Low-fat (1%) or fat-free (skim) milk. Reduced-fat, low-fat, or fat-free cheeses. Nonfat, low-sodium ricotta or cottage cheese. Low-fat or nonfat yogurt. Low-fat, low-sodium  cheese. Fats and oils Soft margarine without trans fats. Vegetable oil. Reduced-fat, low-fat, or light mayonnaise and salad dressings (reduced-sodium). Canola, safflower, olive, avocado, soybean, and sunflower oils. Avocado. Seasonings and condiments Herbs. Spices. Seasoning mixes without salt. Other foods Unsalted popcorn and pretzels. Fat-free sweets. The items listed above may not be all the foods and drinks you can have. Talk to a dietitian to learn more. What foods should I avoid? Fruits Canned fruit in a light or heavy syrup. Fried fruit. Fruit in cream or butter sauce. Vegetables Creamed or fried vegetables. Vegetables in a cheese sauce. Regular canned vegetables that are not marked as low-sodium or reduced-sodium. Regular canned tomato sauce and paste that are not marked as low-sodium or reduced-sodium. Regular tomato and vegetable juices that are not marked as low-sodium or reduced-sodium. Dene. Olives. Grains Baked goods made with fat, such as croissants, muffins, or some breads. Dry pasta or rice meal packs. Meats and other proteins Fatty cuts of meat. Ribs. Fried meat. [place]ona. Bologna, salami, and other precooked or cured meats, such as sausages or meat loaves, that are not lean and low in  sodium. Fat from the back of a pig (fatback). Bratwurst. Salted nuts and seeds. Canned beans with added salt. Canned or smoked fish. Whole eggs or egg yolks. Chicken or turkey with skin. Dairy Whole or 2% milk, cream, and half-and-half. Whole or full-fat cream cheese. Whole-fat or sweetened yogurt. Full-fat cheese. Nondairy creamers. Whipped toppings. Processed cheese and cheese spreads. Fats and oils Butter. Stick margarine. Lard. Shortening. Ghee. Bacon fat. Tropical oils, such as coconut, palm kernel, or palm oil. Seasonings and condiments Onion salt, garlic salt, seasoned salt, table salt, and sea salt. Worcestershire sauce. Tartar sauce. Barbecue sauce. Teriyaki sauce. Soy sauce, including  reduced-sodium soy sauce. Steak sauce. Canned and packaged gravies. Fish sauce. Oyster sauce. Cocktail sauce. Store-bought horseradish. Ketchup. Mustard. Meat flavorings and tenderizers. Bouillon cubes. Hot sauces. Pre-made or packaged marinades. Pre-made or packaged taco seasonings. Relishes. Regular salad dressings. Other foods Salted popcorn and pretzels. The items listed above may not be all the foods and drinks you should avoid. Talk to a dietitian to learn more. Where to find more information National Heart, Lung, and Blood Institute (NHLBI): buffalodrycleaner.gl American Heart Association (AHA): heart.org Academy of Nutrition and Dietetics: eatright.org National Kidney Foundation (NKF): kidney.org This information is not intended to replace advice given to you by your health care provider. Make sure you discuss any questions you have with your health care provider. Document Revised: 07/28/2022 Document Reviewed: 07/28/2022 Elsevier Patient Education  2024 [employer].

## 2023-08-29 ENCOUNTER — Encounter: Payer: Self-pay | Admitting: Nurse Practitioner

## 2023-08-29 ENCOUNTER — Ambulatory Visit (INDEPENDENT_AMBULATORY_CARE_PROVIDER_SITE_OTHER): Payer: BC Managed Care – PPO | Admitting: Nurse Practitioner

## 2023-08-29 VITALS — BP 140/90 | HR 60 | Temp 98.0°F | Ht 69.5 in | Wt 145.2 lb

## 2023-08-29 DIAGNOSIS — I10 Essential (primary) hypertension: Secondary | ICD-10-CM

## 2023-08-29 DIAGNOSIS — F17210 Nicotine dependence, cigarettes, uncomplicated: Secondary | ICD-10-CM | POA: Diagnosis not present

## 2023-08-29 DIAGNOSIS — E782 Mixed hyperlipidemia: Secondary | ICD-10-CM

## 2023-08-29 DIAGNOSIS — E876 Hypokalemia: Secondary | ICD-10-CM | POA: Diagnosis not present

## 2023-08-29 MED ORDER — CHOLECALCIFEROL 1.25 MG (50000 UT) PO TABS: 1.0000 | ORAL_TABLET | ORAL | 12 refills | Status: AC

## 2023-08-29 MED ORDER — SPIRONOLACTONE 25 MG PO TABS: 37.5000 mg | ORAL_TABLET | Freq: Every day | ORAL | 4 refills | Status: DC

## 2023-08-29 NOTE — Progress Notes (Deleted)
 BP (!) 151/94   Pulse 66   Temp 98 F (36.7 C) (Oral)   Ht 5' 9.5 (1.765 m)   Wt 145 lb 3.2 oz (65.9 kg)   SpO2 96%   BMI 21.13 kg/m    Subjective:    Patient ID: Brian Walker, male    DOB: [birth year], 63 y.o.   MRN: [id number]  HPI: Brian Walker is a 63 y.o. male  Chief Complaint  Patient presents with   Hyperlipidemia   Hypertension   HYPERTENSION / HYPERLIPIDEMIA Follow-up on BP today, 04/26/23 added Spironolactone .  He is not taking potassium as instructed and last K+ level was 3.1. Currently taking Spironolactone  and Valsartan  for HTN and Crestor  for HLD.  Took Amlodipine  in past which caused fatigue caused him to feel ill.     Lots of stressors at home with his wife due to her mental health issues which are not well-controlled. Does not want to leave home though as they have a 33-year-old they care for and does not want to leave her. Has been looking for his own place.  He continues to smoke <1 PPD. Satisfied with current treatment? {Blank single:19197::yes,no} Duration of hypertension: {Blank single:19197::chronic,months,years} BP monitoring frequency: {Blank single:19197::not checking,rarely,daily,weekly,monthly,a few times a day,a few times a week,a few times a month} BP range:  BP medication side effects: {Blank single:19197::yes,no} Duration of hyperlipidemia: {Blank single:19197::chronic,months,years} Cholesterol medication side effects: {Blank single:19197::yes,no} Cholesterol supplements: {Blank multiple:19196::none,fish oil,niacin,red yeast rice} Medication compliance: {Blank single:19197::excellent compliance,good compliance,fair compliance,poor compliance} Aspirin: {Blank single:19197::yes,no} Recent stressors: {Blank single:19197::yes,no} Recurrent headaches: {Blank single:19197::yes,no} Visual changes: {Blank single:19197::yes,no} Palpitations: {Blank  single:19197::yes,no} Dyspnea: {Blank single:19197::yes,no} Chest pain: {Blank single:19197::yes,no} Lower extremity edema: {Blank single:19197::yes,no} Dizzy/lightheaded: {Blank single:19197::yes,no}   Relevant past medical, surgical, family and social history reviewed and updated as indicated. Interim medical history since our last visit reviewed. Allergies and medications reviewed and updated.  Review of Systems  Per HPI unless specifically indicated above     Objective:    BP (!) 151/94   Pulse 66   Temp 98 F (36.7 C) (Oral)   Ht 5' 9.5 (1.765 m)   Wt 145 lb 3.2 oz (65.9 kg)   SpO2 96%   BMI 21.13 kg/m   Wt Readings from Last 3 Encounters:  08/29/23 145 lb 3.2 oz (65.9 kg)  07/04/23 148 lb 12.8 oz (67.5 kg)  06/21/23 149 lb 3.2 oz (67.7 kg)    Physical Exam  Results for orders placed or performed in visit on 07/04/23  Basic metabolic panel   Collection Time: 07/04/23  9:30 AM  Result Value Ref Range   Glucose 81 70 - 99 mg/dL   BUN 12 8 - 27 mg/dL   Creatinine, Ser 8.98 0.76 - 1.27 mg/dL   eGFR 83 >40 fO/fpw/8.26   BUN/Creatinine Ratio 12 10 - 24   Sodium 141 134 - 144 mmol/L   Potassium 3.3 (L) 3.5 - 5.2 mmol/L   Chloride 99 96 - 106 mmol/L   CO2 28 20 - 29 mmol/L   Calcium  9.5 8.6 - 10.2 mg/dL      Assessment & Plan:   Problem List Items Addressed This Visit       Cardiovascular and Mediastinum   Essential hypertension - Primary   Relevant Orders   Comprehensive metabolic panel     Other   Hypokalemia   Relevant Orders   Comprehensive metabolic panel   Mixed hyperlipidemia   Relevant Orders   Comprehensive metabolic panel   Lipid  Panel w/o Chol/HDL Ratio   Nicotine dependence, cigarettes, uncomplicated     Follow up plan: No follow-ups on file.

## 2023-08-29 NOTE — Assessment & Plan Note (Signed)
 Follow-up on BP today, 08/29/23 increased Spironolactone  to 37.5 mg daily. He has been taking his potassium as instructed. Instructed not to take an extra dose of Valsartan  before 24 hours, as he is at the max dosage currently. Continue to take Crestor  for HLD.   Still with lots of stressors at home but working on resolving the issues. Feels blood pressure with improve at that point. He continues to smoke < 1 PPD.  Return for follow-up in 5 weeks.

## 2023-08-29 NOTE — Progress Notes (Signed)
 BP (!) 140/90 (BP Location: Left Arm, Cuff Size: Normal)   Pulse 60   Temp 98 F (36.7 C) (Oral)   Ht 5' 9.5 (1.765 m)   Wt 145 lb 3.2 oz (65.9 kg)   SpO2 96%   BMI 21.13 kg/m    Subjective:    Patient ID: Brian Walker, male    DOB: 1960-09-29, 63 y.o.   MRN: [id number]  HPI: Brian Walker is a 63 y.o. male  Chief Complaint  Patient presents with   Hyperlipidemia   Hypertension   HYPERTENSION / HYPERLIPIDEMIA Follow-up on BP today, 04/26/23 added Spironolactone .  He is not taking potassium as instructed and last K+ level was 3.3. Currently taking Spironolactone  and Valsartan  for HTN and Crestor  for HLD.  Took Amlodipine  in past which caused fatigue caused him to feel ill.     Lots of stressors at home with his wife due to her mental health issues which are not well-controlled. Does not want to leave home though as they have a 67-year-old they care for and does not want to leave her. Has been looking for his own place.  He continues to smoke <1 PPD. Satisfied with current treatment? yes Duration of hypertension: chronic BP monitoring frequency: a few times a week BP range: 140/86 BP medication side effects: no Duration of hyperlipidemia: months Cholesterol medication side effects: no Cholesterol supplements: none Medication compliance: good compliance Aspirin: yes Recent stressors: no Recurrent headaches:  rare , these have improved Visual changes: no Palpitations: no Dyspnea:  Short of breath, wheezing  -- with activity at times Chest pain: no Lower extremity edema:  occasionally Dizzy/lightheaded: no   Relevant past medical, surgical, family and social history reviewed and updated as indicated. Interim medical history since our last visit reviewed. Allergies and medications reviewed and updated.  Review of Systems  Constitutional:  Positive for fatigue. Negative for appetite change, fever and unexpected weight change.  Eyes:  Negative for pain, discharge,  redness, itching and visual disturbance.  Respiratory:  Positive for shortness of breath and wheezing. Negative for cough and chest tightness.   Cardiovascular:  Negative for chest pain, palpitations and leg swelling.  Gastrointestinal:  Negative for abdominal pain, constipation, diarrhea, nausea and vomiting.  Endocrine: Positive for cold intolerance. Negative for heat intolerance.  Neurological:  Negative for dizziness, speech difficulty, weakness and light-headedness.  Hematological:  Bruises/bleeds easily.  Psychiatric/Behavioral:  Negative for self-injury and suicidal ideas.     Per HPI unless specifically indicated above     Objective:    BP (!) 140/90 (BP Location: Left Arm, Cuff Size: Normal)   Pulse 60   Temp 98 F (36.7 C) (Oral)   Ht 5' 9.5 (1.765 m)   Wt 145 lb 3.2 oz (65.9 kg)   SpO2 96%   BMI 21.13 kg/m   Wt Readings from Last 3 Encounters:  08/29/23 145 lb 3.2 oz (65.9 kg)  07/04/23 148 lb 12.8 oz (67.5 kg)  06/21/23 149 lb 3.2 oz (67.7 kg)    Physical Exam Constitutional:      General: He is awake. He is not in acute distress.    Appearance: Normal appearance. He is well-groomed. He is not ill-appearing or toxic-appearing.  HENT:     Right Ear: Hearing and external ear normal.     Left Ear: Hearing and external ear normal.  Eyes:     General: Lids are normal.     Extraocular Movements: Extraocular movements intact.  Neck:  Thyroid : No thyromegaly.     Vascular: No carotid bruit.  Cardiovascular:     Rate and Rhythm: Normal rate and regular rhythm.     Heart sounds: Normal heart sounds. No murmur heard.    No gallop.  Pulmonary:     Effort: Pulmonary effort is normal. No accessory muscle usage or respiratory distress.     Breath sounds: Normal breath sounds. No decreased breath sounds or wheezing.  Abdominal:     General: Abdomen is flat. Bowel sounds are normal. There is no distension.     Palpations: Abdomen is soft.     Tenderness: There is no  abdominal tenderness.  Musculoskeletal:        General: No swelling. Normal range of motion.     Cervical back: Normal range of motion and neck supple.     Right lower leg: No edema.     Left lower leg: No edema.  Skin:    General: Skin is warm and dry.     Findings: No bruising or erythema.  Neurological:     Mental Status: He is alert and oriented to person, place, and time.  Psychiatric:        Attention and Perception: Attention normal.        Mood and Affect: Mood normal.        Speech: Speech normal.        Behavior: Behavior normal. Behavior is cooperative.        Thought Content: Thought content normal.    Results for orders placed or performed in visit on 07/04/23  Basic metabolic panel   Collection Time: 07/04/23  9:30 AM  Result Value Ref Range   Glucose 81 70 - 99 mg/dL   BUN 12 8 - 27 mg/dL   Creatinine, Ser 8.98 0.76 - 1.27 mg/dL   eGFR 83 >40 fO/fpw/8.26   BUN/Creatinine Ratio 12 10 - 24   Sodium 141 134 - 144 mmol/L   Potassium 3.3 (L) 3.5 - 5.2 mmol/L   Chloride 99 96 - 106 mmol/L   CO2 28 20 - 29 mmol/L   Calcium  9.5 8.6 - 10.2 mg/dL      Assessment & Plan:   Problem List Items Addressed This Visit       Cardiovascular and Mediastinum   Essential hypertension - Primary   Follow-up on BP today, 08/29/23 increased Spironolactone  to 37.5 mg daily. He has been taking his potassium as instructed. Instructed not to take an extra dose of Valsartan  before 24 hours, as he is at the max dosage currently. Continue to take Crestor  for HLD.   Still with lots of stressors at home but working on resolving the issues. Feels blood pressure with improve at that point. He continues to smoke < 1 PPD.  Return for follow-up in 5 weeks.         Relevant Medications   spironolactone  (ALDACTONE ) 25 MG tablet   Other Relevant Orders   Comprehensive metabolic panel     Other   Hypokalemia   Relevant Orders   Comprehensive metabolic panel   Mixed hyperlipidemia    Relevant Medications   spironolactone  (ALDACTONE ) 25 MG tablet   Other Relevant Orders   Comprehensive metabolic panel   Lipid Panel w/o Chol/HDL Ratio   Nicotine dependence, cigarettes, uncomplicated     Follow up plan: Return in about 5 weeks (around 10/03/2023) for HTN -- increased Spironolactone  to 37.5 MG daily.

## 2023-08-29 NOTE — Assessment & Plan Note (Addendum)
Chronic, ongoing. Continue Rosuvastatin at current dose and adjust as needed.  Labs today.

## 2023-08-29 NOTE — Progress Notes (Signed)
 BP (!) 140/90 (BP Location: Left Arm, Cuff Size: Normal)   Pulse 60   Temp 98 F (36.7 C) (Oral)   Ht 5' 9.5 (1.765 m)   Wt 145 lb 3.2 oz (65.9 kg)   SpO2 96%   BMI 21.13 kg/m    Subjective:    Patient ID: Brian Walker, male    DOB: 1961/07/25, 63 y.o.   MRN: [id number]  HPI: Brian Walker is a 63 y.o. male  Chief Complaint  Patient presents with   Hyperlipidemia   Hypertension   NOTE WRITTEN BY DNP STUDENT.  ASSESSMENT AND PLAN OF CARE REVIEWED WITH STUDENT, AGREE WITH ABOVE FINDINGS AND PLAN.   HYPERTENSION / HYPERLIPIDEMIA Follow-up on BP today, 04/26/23 added Spironolactone .  He is not taking potassium as instructed and last K+ level was 3.3. Currently taking Spironolactone  and Valsartan  for HTN and Crestor  for HLD.  Took Amlodipine  in past which caused fatigue caused him to feel ill.     Lots of stressors at home with his wife due to her mental health issues which are not well-controlled. Does not want to leave home though as they have a 37-year-old they care for and does not want to leave her. Has been looking for his own place.  He continues to smoke <1 PPD. Satisfied with current treatment? yes Duration of hypertension: chronic BP monitoring frequency: a few times a week BP range: 140/86 BP medication side effects: no Duration of hyperlipidemia: months Cholesterol medication side effects: no Cholesterol supplements: none Medication compliance: good compliance Aspirin: yes Recent stressors: no Recurrent headaches:  rare , these have improved Visual changes: no Palpitations: no Dyspnea:  Short of breath, wheezing  -- with activity at times Chest pain: no Lower extremity edema:  occasionally Dizzy/lightheaded: no   Relevant past medical, surgical, family and social history reviewed and updated as indicated. Interim medical history since our last visit reviewed. Allergies and medications reviewed and updated.  Review of Systems  Constitutional:  Positive  for fatigue. Negative for appetite change, fever and unexpected weight change.  Eyes:  Negative for pain, discharge, redness, itching and visual disturbance.  Respiratory:  Positive for shortness of breath and wheezing. Negative for cough and chest tightness.   Cardiovascular:  Negative for chest pain, palpitations and leg swelling.  Gastrointestinal:  Negative for abdominal pain, constipation, diarrhea, nausea and vomiting.  Endocrine: Positive for cold intolerance. Negative for heat intolerance.  Neurological:  Negative for dizziness, speech difficulty, weakness and light-headedness.  Hematological:  Bruises/bleeds easily.  Psychiatric/Behavioral:  Negative for self-injury and suicidal ideas.     Per HPI unless specifically indicated above     Objective:    BP (!) 140/90 (BP Location: Left Arm, Cuff Size: Normal)   Pulse 60   Temp 98 F (36.7 C) (Oral)   Ht 5' 9.5 (1.765 m)   Wt 145 lb 3.2 oz (65.9 kg)   SpO2 96%   BMI 21.13 kg/m   Wt Readings from Last 3 Encounters:  08/29/23 145 lb 3.2 oz (65.9 kg)  07/04/23 148 lb 12.8 oz (67.5 kg)  06/21/23 149 lb 3.2 oz (67.7 kg)    Physical Exam Constitutional:      General: He is awake. He is not in acute distress.    Appearance: Normal appearance. He is well-groomed. He is not ill-appearing or toxic-appearing.  HENT:     Right Ear: Hearing and external ear normal.     Left Ear: Hearing and external ear normal.  Eyes:     General: Lids are normal.     Extraocular Movements: Extraocular movements intact.  Neck:     Thyroid : No thyromegaly.     Vascular: No carotid bruit.  Cardiovascular:     Rate and Rhythm: Normal rate and regular rhythm.     Heart sounds: Normal heart sounds. No murmur heard.    No gallop.  Pulmonary:     Effort: Pulmonary effort is normal. No accessory muscle usage or respiratory distress.     Breath sounds: Normal breath sounds. No decreased breath sounds or wheezing.  Abdominal:     General: Abdomen is  flat. Bowel sounds are normal. There is no distension.     Palpations: Abdomen is soft.     Tenderness: There is no abdominal tenderness.  Musculoskeletal:        General: No swelling. Normal range of motion.     Cervical back: Normal range of motion and neck supple.     Right lower leg: No edema.     Left lower leg: No edema.  Skin:    General: Skin is warm and dry.     Findings: No bruising or erythema.  Neurological:     Mental Status: He is alert and oriented to person, place, and time.  Psychiatric:        Attention and Perception: Attention normal.        Mood and Affect: Mood normal.        Speech: Speech normal.        Behavior: Behavior normal. Behavior is cooperative.        Thought Content: Thought content normal.    Results for orders placed or performed in visit on 07/04/23  Basic metabolic panel   Collection Time: 07/04/23  9:30 AM  Result Value Ref Range   Glucose 81 70 - 99 mg/dL   BUN 12 8 - 27 mg/dL   Creatinine, Ser 8.98 0.76 - 1.27 mg/dL   eGFR 83 >40 fO/fpw/8.26   BUN/Creatinine Ratio 12 10 - 24   Sodium 141 134 - 144 mmol/L   Potassium 3.3 (L) 3.5 - 5.2 mmol/L   Chloride 99 96 - 106 mmol/L   CO2 28 20 - 29 mmol/L   Calcium  9.5 8.6 - 10.2 mg/dL      Assessment & Plan:   Problem List Items Addressed This Visit       Cardiovascular and Mediastinum   Essential hypertension - Primary   Follow-up on BP today, 08/29/23 increased Spironolactone  to 37.5 mg daily. He has been taking his potassium as instructed. Instructed not to take an extra dose of Valsartan  before 24 hours, as he is at the max dosage currently. Continue to take Crestor  for HLD.   Still with lots of stressors at home but working on resolving the issues. Feels blood pressure with improve at that point. He continues to smoke < 1 PPD.  Return for follow-up in 5 weeks.         Relevant Medications   spironolactone  (ALDACTONE ) 25 MG tablet   Other Relevant Orders   Comprehensive  metabolic panel     Other   Hypokalemia   History of hypokalemia since 2017 with fluctuating levels. Recheck today. He is taking his supplement as instructed.      Relevant Orders   Comprehensive metabolic panel   Mixed hyperlipidemia   Chronic, ongoing. Continue Rosuvastatin  at current dose and adjust as needed.  Labs today.      Relevant  Medications   spironolactone  (ALDACTONE ) 25 MG tablet   Other Relevant Orders   Comprehensive metabolic panel   Lipid Panel w/o Chol/HDL Ratio   Nicotine dependence, cigarettes, uncomplicated   I have recommended complete cessation of tobacco use. I have discussed various options available for assistance with tobacco cessation including over the counter methods (Nicotine gum, patch and lozenges). We also discussed prescription options (Chantix, Nicotine Inhaler / Nasal Spray). The patient is not interested in pursuing any prescription tobacco cessation options at this time.         Follow up plan: Return in about 5 weeks (around 10/03/2023) for HTN -- increased Spironolactone  to 37.5 MG daily.

## 2023-08-29 NOTE — Assessment & Plan Note (Signed)
History of hypokalemia since 2017 with fluctuating levels. Recheck today. He is taking his supplement as instructed.

## 2023-08-29 NOTE — Assessment & Plan Note (Signed)
 I have recommended complete cessation of tobacco use. I have discussed various options available for assistance with tobacco cessation including over the counter methods (Nicotine gum, patch and lozenges). We also discussed prescription options (Chantix, Nicotine Inhaler / Nasal Spray). The patient is not interested in pursuing any prescription tobacco cessation options at this time.

## 2023-08-30 ENCOUNTER — Encounter: Payer: Self-pay | Admitting: Nurse Practitioner

## 2023-08-30 LAB — COMPREHENSIVE METABOLIC PANEL
ALT: 11 [IU]/L (ref 0–44)
AST: 16 [IU]/L (ref 0–40)
Albumin: 4.2 g/dL (ref 3.9–4.9)
Alkaline Phosphatase: 94 [IU]/L (ref 44–121)
BUN/Creatinine Ratio: 11 (ref 10–24)
BUN: 12 mg/dL (ref 8–27)
Bilirubin Total: 0.4 mg/dL (ref 0.0–1.2)
CO2: 24 mmol/L (ref 20–29)
Calcium: 9.6 mg/dL (ref 8.6–10.2)
Chloride: 102 mmol/L (ref 96–106)
Creatinine, Ser: 1.1 mg/dL (ref 0.76–1.27)
Globulin, Total: 2.4 g/dL (ref 1.5–4.5)
Glucose: 101 mg/dL — ABNORMAL HIGH (ref 70–99)
Potassium: 3.8 mmol/L (ref 3.5–5.2)
Sodium: 141 mmol/L (ref 134–144)
Total Protein: 6.6 g/dL (ref 6.0–8.5)
eGFR: 75 mL/min/{1.73_m2} (ref >59)

## 2023-08-30 LAB — LIPID PANEL W/O CHOL/HDL RATIO
Cholesterol, Total: 138 mg/dL (ref 100–199)
HDL: 59 mg/dL (ref >39)
LDL Chol Calc (NIH): 65 mg/dL (ref 0–99)
Triglycerides: 72 mg/dL (ref 0–149)
VLDL Cholesterol Cal: 14 mg/dL (ref 5–40)

## 2023-08-30 NOTE — Progress Notes (Signed)
 Contacted via MyChart   Good evening Brian Walker, your labs have returned and potassium level is improving!!!  [employer]. Stop potassium supplement.  Continue all other medications.  Lipid panel looks great!!  Any questions? Keep being stellar!!  Thank you for allowing me to participate in your care.  I appreciate you. Kindest regards, Ezzard Ditmer

## 2023-09-19 ENCOUNTER — Ambulatory Visit: Payer: Self-pay | Admitting: Nurse Practitioner

## 2023-09-19 NOTE — Telephone Encounter (Signed)
 Copied from CRM [id number]. Topic: Clinical - Red Word Triage >> Sep 19, 2023 10:43 AM Carlatta H wrote: [facility] that prompted transfer to Nurse Triage: Patient stated he is experiencing severe pain in legs and hands//Patient is unable to pick up anything with his left hand  Chief Complaint: bilateral hand pain  Symptoms: left wrist swollen and pain is severe to wrist and hand, unable to play guitar, or pick up remote Frequency: ongoing  Pertinent Negatives: Patient denies numbness tingling Disposition: [] ED /[] Urgent Care (no appt availability in office) / [x] Appointment(In office/virtual)/ []  [facility]/ [] Home Care/ [] Refused Recommended Disposition /[] [facility]/ []  Follow-up with PCP Additional Notes: needs appt sooner. Only wants to see PCP  Reason for Disposition  [1] SEVERE pain (e.g., excruciating, unable to use hand at all) AND [2] not improved after 2 hours of pain medicine  Answer Assessment - Initial Assessment Questions 1. ONSET: "When did the pain start?"     Ongoing  2. LOCATION: "Where is the pain located?"     Both hands left greater than right  3. PAIN: "How bad is the pain?" (Scale 1-10; or mild, moderate, severe)   - MILD (1-3): doesn't interfere with normal activities   - MODERATE (4-7): interferes with normal activities (e.g., work or school) or awakens from sleep   - SEVERE (8-10): excruciating pain, unable to use hand at all     Severe to left hand having to use right hand more 4. WORK OR EXERCISE: "Has there been any recent work or exercise that involved this part (i.e., hand or wrist) of the body?"     no 5. CAUSE: "What do you think is causing the pain?"     ? arthritis 6. AGGRAVATING FACTORS: "What makes the pain worse?" (e.g., using computer)     Using the hand  7. OTHER SYMPTOMS: "Do you have any other symptoms?" (e.g., neck pain, swelling, rash, numbness, fever)     Wrist swollen 1/3" left > right , unable to grip or pick up  remote or play guitar, tremor for 2 years  Protocols used: Hand and Wrist Pain-A-AH

## 2023-09-19 NOTE — Telephone Encounter (Signed)
 Appointment has been made

## 2023-09-19 NOTE — Telephone Encounter (Signed)
 Routing to provider to advise if patient can be worked in somewhere.

## 2023-09-23 NOTE — Patient Instructions (Signed)
 Muscle Cramps and Spasms Muscle cramps and spasms occur when a muscle or muscles tighten and you have no control over this tightening (involuntary muscle contraction). They are a common problem that can happen in any muscle. The most common place is in the calf muscles of the leg. There are a few ways that muscle cramps and spasms differ: Muscle cramps are painful. They come and go and may last for a few seconds or up to 15 minutes. Muscle cramps are often more forceful and last longer than muscle spasms. Muscle spasms may or may not be painful. They may last just a few seconds or last much longer. Certain conditions, such as diabetes or Parkinson's disease, can make you more likely to have cramps or spasms. But in most cases, cramps and spasms are not caused by other conditions. Common causes include: Overexertion. This is when you do more physical work or exercise than your body is ready for. Overuse from doing the same movements too many times. Staying in one position for too long. Improper preparation, form, or technique when playing a sport or doing an activity. Not enough water or other fluids in your body (dehydration). Other causes may include: Injury. Side effects of some medicines. Too few salts and minerals in your body (electrolytes), such as potassium and calcium. This could happen if you are taking water pills (diuretics) or if you are pregnant. In many cases, the cause of muscle cramps or spasms is not known. Follow these instructions at home: Eating and drinking Drink enough fluid to keep your pee (urine) pale yellow. This can help prevent cramps or spasms. Eat a healthy diet that includes a lot of nutrients to help your muscles work. A healthy diet includes fruits and vegetables, lean protein, whole grains, and low-fat or nonfat dairy products. Managing pain and stiffness     Try to massage, stretch, and relax the affected muscle. Do this for a few minutes at a time. If told,  put ice on the muscles. This may help if you are sore or have pain after a cramp or spasm. Put ice in a plastic bag. Place a towel between your skin and the bag. Leave the ice on for 20 minutes, 2-3 times a day. If told, apply heat to tight or tense muscles as often as told by your health care provider. Use the heat source that your provider recommends, such as a moist heat pack or a heating pad. Place a towel between your skin and the heat source. Leave the heat on for 20-30 minutes. If your skin turns bright red, remove the ice or heat right away to prevent skin damage. The risk of damage is higher if you cannot feel pain, heat, or cold. Take hot showers or baths to help relax tight muscles. General instructions If you are having cramps often, avoid intense exercise for a few days. Take over-the-counter and prescription medicines only as told by your provider. Watch for any changes in your symptoms. Contact a health care provider if: Your cramps or spasms get more severe or happen more often. Your cramps or spasms do not get better over time. This information is not intended to replace advice given to you by your health care provider. Make sure you discuss any questions you have with your health care provider. Document Revised: 03/01/2022 Document Reviewed: 03/01/2022 Elsevier Patient Education  2024 [employer].

## 2023-09-25 ENCOUNTER — Ambulatory Visit (INDEPENDENT_AMBULATORY_CARE_PROVIDER_SITE_OTHER): Payer: BC Managed Care – PPO | Admitting: Nurse Practitioner

## 2023-09-25 ENCOUNTER — Encounter: Payer: Self-pay | Admitting: Nurse Practitioner

## 2023-09-25 VITALS — BP 148/92 | HR 81 | Temp 98.3°F | Wt 150.4 lb

## 2023-09-25 DIAGNOSIS — E876 Hypokalemia: Secondary | ICD-10-CM | POA: Diagnosis not present

## 2023-09-25 DIAGNOSIS — I10 Essential (primary) hypertension: Secondary | ICD-10-CM | POA: Diagnosis not present

## 2023-09-25 DIAGNOSIS — J432 Centrilobular emphysema: Secondary | ICD-10-CM | POA: Diagnosis not present

## 2023-09-25 DIAGNOSIS — M2550 Pain in unspecified joint: Secondary | ICD-10-CM

## 2023-09-25 DIAGNOSIS — F17210 Nicotine dependence, cigarettes, uncomplicated: Secondary | ICD-10-CM

## 2023-09-25 MED ORDER — METHOCARBAMOL 500 MG PO TABS: 500.0000 mg | ORAL_TABLET | Freq: Three times a day (TID) | ORAL | 0 refills | Status: AC | PRN

## 2023-09-25 NOTE — Assessment & Plan Note (Signed)
 History of fluctuating levels since 2017 with lows occasionally presenting.  Recheck today.  Avoid medications that lower K+ levels.  Has supplement to take.

## 2023-09-25 NOTE — Progress Notes (Signed)
 BP (!) 148/92 (BP Location: Left Arm, Patient Position: Sitting, Cuff Size: Normal)   Pulse 81   Temp 98.3 F (36.8 C) (Oral)   Wt 150 lb 6.4 oz (68.2 kg)   SpO2 98%   BMI 21.89 kg/m    Subjective:    Patient ID: Brian Walker, male    DOB: 1960/09/23, 63 y.o.   MRN: [id number]  HPI: Brian Walker is a 63 y.o. male  Chief Complaint  Patient presents with   Hypertension   body pain    Patient said it's been going on for 5 to 6 years and its been getting worse. He missed work a lot because of his body pain  Left hand was hurting last week.   HYPERTENSION without Chronic Kidney Disease BP elevated today as has been sick for past two weeks with 102 degree temp, taking OTC medications that can elevate BP he reports.  Currently taking Valsartan and Spironolactone. Took Amlodipine in past which caused fatigue, caused him to feel ill. Taking K+ supplement due to history of low levels, even with current BP medications.  Continues to smoke.  No alcohol or drug use.  Underlying emphysema. Hypertension status: uncontrolled  Satisfied with current treatment? yes Duration of hypertension: chronic BP monitoring frequency:  a few times a week BP range: 150/90 range, sometimes down to 140/80 -- before he was sick BP medication side effects:  no Medication compliance: fair compliance Aspirin: no Recurrent headaches: no Visual changes: no Palpitations: no Dyspnea: no Chest pain: no Lower extremity edema: no Dizzy/lightheaded: no   ARTHRALGIAS / JOINT ACHES Has been going on for 5-6 years, but over past <1 year he has something sore all the time.  Gets very stiff often.  If moves awhile will finally get lose.  He is unsure if family history of any autoimmune disease. Duration: months Pain: yes Symmetric: yes  6/10 nagging Quality: dull and aching -- last week had sharp pain in wrists Frequency: consistent in nature at times Context:  worse Decreased function/range of motion:  yes Erythema: on occasion Swelling: yes on occasion Heat or warmth: no Morning stiffness: not unless having a bout Aggravating factors: weather (cold) or use of joints Alleviating factors: heat on joint or elevation Relief with NSAIDs?: No NSAIDs Taken Treatments attempted:  heat, Tylenol (eating it) Involved Joints:     Hands: yes left -- uses this most, left dominant    Wrists: yes bilateral     Elbows: none -- only if overuse    Shoulders: yes bilateral    Back: yes     Hips: yes bilateral    Knees: yes bilateral    Ankles: yes bilateral    Feet: yes bilateral   Relevant past medical, surgical, family and social history reviewed and updated as indicated. Interim medical history since our last visit reviewed. Allergies and medications reviewed and updated.  Review of Systems  Constitutional:  Negative for activity change, diaphoresis, fatigue and fever.  Respiratory:  Negative for cough, chest tightness, shortness of breath and wheezing.   Cardiovascular:  Negative for chest pain, palpitations and leg swelling.  Gastrointestinal: Negative.   Musculoskeletal:  Positive for arthralgias.  Neurological: Negative.   Psychiatric/Behavioral: Negative.      Per HPI unless specifically indicated above     Objective:    BP (!) 148/92 (BP Location: Left Arm, Patient Position: Sitting, Cuff Size: Normal)   Pulse 81   Temp 98.3 F (36.8 C) (Oral)  Wt 150 lb 6.4 oz (68.2 kg)   SpO2 98%   BMI 21.89 kg/m   Wt Readings from Last 3 Encounters:  09/25/23 150 lb 6.4 oz (68.2 kg)  08/29/23 145 lb 3.2 oz (65.9 kg)  07/04/23 148 lb 12.8 oz (67.5 kg)    Physical Exam Vitals and nursing note reviewed.  Constitutional:      General: He is awake. He is not in acute distress.    Appearance: He is well-developed and well-groomed. He is not ill-appearing or toxic-appearing.  HENT:     Head: Normocephalic.  Eyes:     General: Lids are normal.     Extraocular Movements: Extraocular  movements intact.     Conjunctiva/sclera: Conjunctivae normal.  Neck:     Thyroid: No thyromegaly.     Vascular: No carotid bruit.  Cardiovascular:     Rate and Rhythm: Normal rate and regular rhythm.     Heart sounds: Normal heart sounds.  Pulmonary:     Effort: No accessory muscle usage or respiratory distress.     Breath sounds: Normal breath sounds.  Abdominal:     General: Bowel sounds are normal. There is no distension.     Palpations: Abdomen is soft.     Tenderness: There is no abdominal tenderness.  Musculoskeletal:     Right wrist: Normal.     Left wrist: Normal.     Right hand: Swelling (to some of the fingers) present. No tenderness. Decreased range of motion. Normal strength. Normal sensation. Normal pulse.     Left hand: Swelling (to some of the fingers) present. No tenderness. Decreased range of motion. Normal strength. Normal sensation. Normal pulse.     Cervical back: Full passive range of motion without pain.     Thoracic back: Normal.     Lumbar back: Normal.     Right lower leg: No edema.     Left lower leg: No edema.  Lymphadenopathy:     Cervical: No cervical adenopathy.  Skin:    General: Skin is warm.     Capillary Refill: Capillary refill takes less than 2 seconds.  Neurological:     Mental Status: He is alert and oriented to person, place, and time.     Deep Tendon Reflexes: Reflexes are normal and symmetric.     Reflex Scores:      Brachioradialis reflexes are 2+ on the right side and 2+ on the left side.      Patellar reflexes are 2+ on the right side and 2+ on the left side. Psychiatric:        Attention and Perception: Attention normal.        Mood and Affect: Mood normal.        Speech: Speech normal.        Behavior: Behavior normal. Behavior is cooperative.        Thought Content: Thought content normal.    Results for orders placed or performed in visit on 08/29/23  Comprehensive metabolic panel   Collection Time: 08/29/23 10:19 AM   Result Value Ref Range   Glucose 101 (H) 70 - 99 mg/dL   BUN 12 8 - 27 mg/dL   Creatinine, Ser 4.54 0.76 - 1.27 mg/dL   eGFR 75 >09 WJ/XBJ/4.78   BUN/Creatinine Ratio 11 10 - 24   Sodium 141 134 - 144 mmol/L   Potassium 3.8 3.5 - 5.2 mmol/L   Chloride 102 96 - 106 mmol/L   CO2 24 20 - 29 mmol/L  Calcium 9.6 8.6 - 10.2 mg/dL   Total Protein 6.6 6.0 - 8.5 g/dL   Albumin 4.2 3.9 - 4.9 g/dL   Globulin, Total 2.4 1.5 - 4.5 g/dL   Bilirubin Total 0.4 0.0 - 1.2 mg/dL   Alkaline Phosphatase 94 44 - 121 IU/L   AST 16 0 - 40 IU/L   ALT 11 0 - 44 IU/L  Lipid Panel w/o Chol/HDL Ratio   Collection Time: 08/29/23 10:19 AM  Result Value Ref Range   Cholesterol, Total 138 100 - 199 mg/dL   Triglycerides 72 0 - 149 mg/dL   HDL 59 >81 mg/dL   VLDL Cholesterol Cal 14 5 - 40 mg/dL   LDL Chol Calc (NIH) 65 0 - 99 mg/dL      Assessment & Plan:   Problem List Items Addressed This Visit       Cardiovascular and Mediastinum   Essential hypertension   Chronic, with some increase due to recent cold medications.  Will continue Valsartan 320 MG and Spironolactone 25 MG daily, having no ADR with either. Prefer Spironolactone due to his long history of fluctuating low K+ levels, concern for thiazide due to risk for lowering K+ levels. Had fatigue with Amlodipine in past, but ?whether this is related more to home stressors. -Discussed next options with him and educated on how meds work and side effects: BB vs trial of Nifedipine.  He would prefer to try the Nifedipine in future if needed and BP remains elevated next visit.  Would start XL dosing at 30 MG next visit if ongoing elevations.   - Recommend he monitor BP at least a few mornings a week at home and document.   - DASH diet at home.  Labs today: CMP .   - Lengthy discussion with him today about taking medications as ordered and his current high risk for stroke -- offered a SW referral for therapy due to home stressors.  He refuses.         Respiratory   Centrilobular emphysema (HCC) - Primary   Chronic, noted on lung screening 02/10/23.  No current inhalers.  Will obtain spirometry at future visit when feeling better.  Recommend complete cessation of smoking.  Continue annual lung screening.  Inhalers as needed in future.        Other   Hypokalemia   History of fluctuating levels since 2017 with lows occasionally presenting.  Recheck today.  Avoid medications that lower K+ levels.  Has supplement to take.      Multiple joint pain   Ongoing issue for 5-6 years with worsening over past year.  He does use hands a lot, playing guitar and building.  Also does a lot of hands on tasks.  ?OA vs autoimmune or gout.  Check labs today: CBC, BMP, uric acid, Mag, ESR, CRP, ANA.  Determine next steps after all labs returned.  Avoid Ibuprofen products or NSAIDs.  Tylenol as needed.  Will trial Robaxin, which may benefit discomfort in evening.      Relevant Orders   Basic metabolic panel   CBC with Differential/Platelet   Uric acid   Magnesium   C-reactive protein   Sed Rate (ESR)   ANA 12 Plus Profile (RDL)   Nicotine dependence, cigarettes, uncomplicated   I have recommended complete cessation of tobacco use. I have discussed various options available for assistance with tobacco cessation including over the counter methods (Nicotine gum, patch and lozenges). We also discussed prescription options (Chantix, Nicotine Inhaler /  Nasal Spray). The patient is not interested in pursuing any prescription tobacco cessation options at this time.        Time: 25 minutes, >50% spent counseling/or care coordination  Follow up plan: Return in about 3 weeks (around 10/16/2023) for HTN/HLD, Chronic Pain (cancel March 7th and 13th visits).

## 2023-09-25 NOTE — Assessment & Plan Note (Signed)
Chronic, noted on lung screening 02/10/23.  No current inhalers.  Will obtain spirometry at future visit when feeling better.  Recommend complete cessation of smoking.  Continue annual lung screening.  Inhalers as needed in future.

## 2023-09-25 NOTE — Assessment & Plan Note (Signed)
 I have recommended complete cessation of tobacco use. I have discussed various options available for assistance with tobacco cessation including over the counter methods (Nicotine gum, patch and lozenges). We also discussed prescription options (Chantix, Nicotine Inhaler / Nasal Spray). The patient is not interested in pursuing any prescription tobacco cessation options at this time.

## 2023-09-25 NOTE — Assessment & Plan Note (Signed)
 Ongoing issue for 5-6 years with worsening over past year.  He does use hands a lot, playing guitar and building.  Also does a lot of hands on tasks.  ?OA vs autoimmune or gout.  Check labs today: CBC, BMP, uric acid, Mag, ESR, CRP, ANA.  Determine next steps after all labs returned.  Avoid Ibuprofen products or NSAIDs.  Tylenol as needed.  Will trial Robaxin, which may benefit discomfort in evening.

## 2023-09-25 NOTE — Assessment & Plan Note (Signed)
 Chronic, with some increase due to recent cold medications.  Will continue Valsartan 320 MG and Spironolactone 25 MG daily, having no ADR with either. Prefer Spironolactone due to his long history of fluctuating low K+ levels, concern for thiazide due to risk for lowering K+ levels. Had fatigue with Amlodipine in past, but ?whether this is related more to home stressors. -Discussed next options with him and educated on how meds work and side effects: BB vs trial of Nifedipine.  He would prefer to try the Nifedipine in future if needed and BP remains elevated next visit.  Would start XL dosing at 30 MG next visit if ongoing elevations.   - Recommend he monitor BP at least a few mornings a week at home and document.   - DASH diet at home.  Labs today: CMP .   - Lengthy discussion with him today about taking medications as ordered and his current high risk for stroke -- offered a SW referral for therapy due to home stressors.  He refuses.

## 2023-09-26 ENCOUNTER — Other Ambulatory Visit: Payer: Self-pay | Admitting: Nurse Practitioner

## 2023-09-26 ENCOUNTER — Encounter: Payer: Self-pay | Admitting: Nurse Practitioner

## 2023-09-26 MED ORDER — POTASSIUM CHLORIDE CRYS ER 10 MEQ PO TBCR: 10.0000 meq | EXTENDED_RELEASE_TABLET | Freq: Every day | ORAL | 0 refills | Status: DC

## 2023-09-26 NOTE — Progress Notes (Signed)
 Contacted via MyChart   Good morning Clint, waiting on another lab still, but currently labs are stable with exception of potassium being a little low again.  I will send in supplement refills and please ensure to take as ordered.  We will recheck next visit.  Any questions? Keep being stellar!!  Thank you for allowing me to participate in your care.  I appreciate you. Kindest regards, Rage Beever

## 2023-09-29 ENCOUNTER — Ambulatory Visit: Payer: BC Managed Care – PPO | Admitting: Nurse Practitioner

## 2023-10-03 NOTE — Progress Notes (Signed)
 Contacted via MyChart   Good morning Brian Walker, your ANA returned and so far is showing some elevation in positive range.  I am waiting for remainder to return, but do recommend a referral to rheumatology once everything does return for further assessment to ensure no underlying autoimmune disease causing your joint pains.  Any questions? Keep being stellar!!  Thank you for allowing me to participate in your care.  I appreciate you. Kindest regards, Jennessy Sandridge

## 2023-10-05 ENCOUNTER — Ambulatory Visit: Payer: BC Managed Care – PPO | Admitting: Nurse Practitioner

## 2023-10-11 LAB — STATUS REPORT

## 2023-10-13 LAB — BASIC METABOLIC PANEL
BUN/Creatinine Ratio: 11 (ref 10–24)
BUN: 12 mg/dL (ref 8–27)
CO2: 25 mmol/L (ref 20–29)
Calcium: 10 mg/dL (ref 8.6–10.2)
Chloride: 102 mmol/L (ref 96–106)
Creatinine, Ser: 1.09 mg/dL (ref 0.76–1.27)
Glucose: 70 mg/dL (ref 70–99)
Potassium: 3.4 mmol/L — ABNORMAL LOW (ref 3.5–5.2)
Sodium: 142 mmol/L (ref 134–144)
eGFR: 76 mL/min/{1.73_m2} (ref >59)

## 2023-10-13 LAB — CBC WITH DIFFERENTIAL/PLATELET
Basophils Absolute: 0.1 10*3/uL (ref 0.0–0.2)
Basos: 1 %
EOS (ABSOLUTE): 0.1 10*3/uL (ref 0.0–0.4)
Eos: 1 %
Hematocrit: 45.5 % (ref 37.5–51.0)
Hemoglobin: 15.1 g/dL (ref 13.0–17.7)
Immature Grans (Abs): 0 10*3/uL (ref 0.0–0.1)
Immature Granulocytes: 0 %
Lymphocytes Absolute: 2.6 10*3/uL (ref 0.7–3.1)
Lymphs: 31 %
MCH: 30.2 pg (ref 26.6–33.0)
MCHC: 33.2 g/dL (ref 31.5–35.7)
MCV: 91 fL (ref 79–97)
Monocytes Absolute: 0.6 10*3/uL (ref 0.1–0.9)
Monocytes: 8 %
Neutrophils Absolute: 5.1 10*3/uL (ref 1.4–7.0)
Neutrophils: 59 %
Platelets: 279 10*3/uL (ref 150–450)
RBC: 5 x10E6/uL (ref 4.14–5.80)
RDW: 14.6 % (ref 11.6–15.4)
WBC: 8.6 10*3/uL (ref 3.4–10.8)

## 2023-10-13 LAB — ANA 12 PLUS PROFILE, POSITIVE
Anti-Centromere Ab (RDL): <1:40 {titer}
Anti-La (SS-B) Ab (RDL): <20 U (ref <20)
Anti-Ro (SS-A) Ab (RDL): <20 U (ref <20)
Anti-Scl-70 Ab (RDL): <20 U (ref <20)
Anti-Sm Ab (RDL): <20 U (ref <20)
Anti-U1 RNP Ab (RDL): <20 U (ref <20)
Speckled Pattern: 1:640 {titer} — ABNORMAL HIGH

## 2023-10-13 LAB — MAGNESIUM: Magnesium: 1.8 mg/dL (ref 1.6–2.3)

## 2023-10-13 LAB — C-REACTIVE PROTEIN: CRP: <1 mg/L (ref 0–10)

## 2023-10-13 LAB — ANA 12 PLUS PROFILE (RDL): Anti-Nuclear Ab by IFA (RDL): POSITIVE — AB

## 2023-10-13 LAB — URIC ACID: Uric Acid: 4.6 mg/dL (ref 3.8–8.4)

## 2023-10-13 LAB — SEDIMENTATION RATE: Sed Rate: 6 mm/h (ref 0–30)

## 2023-10-13 NOTE — Progress Notes (Signed)
 Contacted via MyChart   Remainder of ANA testing could not be performed due to not enough blood.  However, based on speckled pattern level I would recommend a rheumatology visit.  Would you be okay with this?  Let me know.

## 2023-10-22 DIAGNOSIS — R768 Other specified abnormal immunological findings in serum: Secondary | ICD-10-CM | POA: Insufficient documentation

## 2023-10-22 NOTE — Patient Instructions (Signed)
 Be Involved in Caring For Your Health:  Taking Medications When medications are taken as directed, they can greatly improve your health. But if they are not taken as prescribed, they may not work. In some cases, not taking them correctly can be harmful. To help ensure your treatment remains effective and safe, understand your medications and how to take them. Bring your medications to each visit for review by your provider.  Your lab results, notes, and after visit summary will be available on My Chart. We strongly encourage you to use this feature. If lab results are abnormal the clinic will contact you with the appropriate steps. If the clinic does not contact you assume the results are satisfactory. You can always view your results on My Chart. If you have questions regarding your health or results, please contact the clinic during office hours. You can also ask questions on My Chart.  We at [facility] are grateful that you chose [place] to provide your care. We strive to provide evidence-based and compassionate care and are always looking for feedback. If you get a survey from the clinic please complete this so we can hear your opinions.  Heart-Healthy Eating Plan Many factors influence your heart health, including eating and exercise habits. Heart health is also called coronary health. Coronary risk increases with abnormal blood fat (lipid) levels. A heart-healthy eating plan includes limiting unhealthy fats, increasing healthy fats, limiting salt (sodium) intake, and making other diet and lifestyle changes. What is my plan? Your health care provider may recommend that: You limit your fat intake to _________% or less of your total calories each day. You limit your saturated fat intake to _________% or less of your total calories each day. You limit the amount of cholesterol in your diet to less than _________ mg per day. You limit the amount of sodium in your diet to less than _________  mg per day. What are tips for following this plan? Cooking Cook foods using methods other than frying. Baking, boiling, grilling, and broiling are all good options. Other ways to reduce fat include: Removing the skin from poultry. Removing all visible fats from meats. Steaming vegetables in water or broth. Meal planning  At meals, imagine dividing your plate into fourths: Fill one-half of your plate with vegetables and green salads. Fill one-fourth of your plate with whole grains. Fill one-fourth of your plate with lean protein foods. Eat 2-4 cups of vegetables per day. One cup of vegetables equals 1 cup (91 g) broccoli or cauliflower florets, 2 medium carrots, 1 large bell pepper, 1 large sweet potato, 1 large tomato, 1 medium white potato, 2 cups (150 g) raw leafy greens. Eat 1-2 cups of fruit per day. One cup of fruit equals 1 small apple, 1 large banana, 1 cup (237 g) mixed fruit, 1 large orange,  cup (82 g) dried fruit, 1 cup (240 mL) 100% fruit juice. Eat more foods that contain soluble fiber. Examples include apples, broccoli, carrots, beans, peas, and barley. Aim to get 25-30 g of fiber per day. Increase your consumption of legumes, nuts, and seeds to 4-5 servings per week. One serving of dried beans or legumes equals  cup (90 g) cooked, 1 serving of nuts is  oz (12 almonds, 24 pistachios, or 7 walnut halves), and 1 serving of seeds equals  oz (8 g). Fats Choose healthy fats more often. Choose monounsaturated and polyunsaturated fats, such as olive and canola oils, avocado oil, flaxseeds, walnuts, almonds, and seeds. Eat  more omega-3 fats. Choose salmon, mackerel, sardines, tuna, flaxseed oil, and ground flaxseeds. Aim to eat fish at least 2 times each week. Check food labels carefully to identify foods with trans fats or high amounts of saturated fat. Limit saturated fats. These are found in animal products, such as meats, butter, and cream. Plant sources of saturated fats  include palm oil, palm kernel oil, and coconut oil. Avoid foods with partially hydrogenated oils in them. These contain trans fats. Examples are stick margarine, some tub margarines, cookies, crackers, and other baked goods. Avoid fried foods. General information Eat more home-cooked food and less restaurant, buffet, and fast food. Limit or avoid alcohol. Limit foods that are high in added sugar and simple starches such as foods made using white refined flour (white breads, pastries, sweets). Lose weight if you are overweight. Losing just 5-10% of your body weight can help your overall health and prevent diseases such as diabetes and heart disease. Monitor your sodium intake, especially if you have high blood pressure. Talk with your health care provider about your sodium intake. Try to incorporate more vegetarian meals weekly. What foods should I eat? Fruits All fresh, canned (in natural juice), or frozen fruits. Vegetables Fresh or frozen vegetables (raw, steamed, roasted, or grilled). Green salads. Grains Most grains. Choose whole wheat and whole grains most of the time. Rice and pasta, including brown rice and pastas made with whole wheat. Meats and other proteins Lean, well-trimmed beef, veal, pork, and lamb. Chicken and [place] without skin. All fish and shellfish. Wild duck, rabbit, pheasant, and venison. Egg whites or low-cholesterol egg substitutes. Dried beans, peas, lentils, and tofu. Seeds and most nuts. Dairy Low-fat or nonfat cheeses, including ricotta and mozzarella. Skim or 1% milk (liquid, powdered, or evaporated). Buttermilk made with low-fat milk. Nonfat or low-fat yogurt. Fats and oils Non-hydrogenated (trans-free) margarines. Vegetable oils, including soybean, sesame, sunflower, olive, avocado, peanut, safflower, corn, canola, and cottonseed. Salad dressings or mayonnaise made with a vegetable oil. Beverages Water (mineral or sparkling). Coffee and tea. Unsweetened ice  tea. Diet beverages. Sweets and desserts Sherbet, gelatin, and fruit ice. Small amounts of dark chocolate. Limit all sweets and desserts. Seasonings and condiments All seasonings and condiments. The items listed above may not be a complete list of foods and beverages you can eat. Contact a dietitian for more options. What foods should I avoid? Fruits Canned fruit in heavy syrup. Fruit in cream or butter sauce. Fried fruit. Limit coconut. Vegetables Vegetables cooked in cheese, cream, or butter sauce. Fried vegetables. Grains Breads made with saturated or trans fats, oils, or whole milk. Croissants. Sweet rolls. Donuts. High-fat crackers, such as cheese crackers and chips. Meats and other proteins Fatty meats, such as hot dogs, ribs, sausage, bacon, rib-eye roast or steak. High-fat deli meats, such as salami and bologna. Caviar. Domestic duck and goose. Organ meats, such as liver. Dairy Cream, sour cream, cream cheese, and creamed cottage cheese. Whole-milk cheeses. Whole or 2% milk (liquid, evaporated, or condensed). Whole buttermilk. Cream sauce or high-fat cheese sauce. Whole-milk yogurt. Fats and oils Meat fat, or shortening. Cocoa butter, hydrogenated oils, palm oil, coconut oil, palm kernel oil. Solid fats and shortenings, including bacon fat, salt pork, lard, and butter. Nondairy cream substitutes. Salad dressings with cheese or sour cream. Beverages Regular sodas and any drinks with added sugar. Sweets and desserts Frosting. Pudding. Cookies. Cakes. Pies. Milk chocolate or white chocolate. Buttered syrups. Full-fat ice cream or ice cream drinks. The items listed above may  not be a complete list of foods and beverages to avoid. Contact a dietitian for more information. Summary Heart-healthy meal planning includes limiting unhealthy fats, increasing healthy fats, limiting salt (sodium) intake and making other diet and lifestyle changes. Lose weight if you are overweight. Losing just  5-10% of your body weight can help your overall health and prevent diseases such as diabetes and heart disease. Focus on eating a balance of foods, including fruits and vegetables, low-fat or nonfat dairy, lean protein, nuts and legumes, whole grains, and heart-healthy oils and fats. This information is not intended to replace advice given to you by your health care provider. Make sure you discuss any questions you have with your health care provider. Document Revised: 08/16/2021 Document Reviewed: 08/16/2021 Elsevier Patient Education  2024 [employer].

## 2023-10-31 ENCOUNTER — Ambulatory Visit (INDEPENDENT_AMBULATORY_CARE_PROVIDER_SITE_OTHER): Admitting: Nurse Practitioner

## 2023-10-31 ENCOUNTER — Encounter: Payer: Self-pay | Admitting: Nurse Practitioner

## 2023-10-31 VITALS — BP 158/90 | HR 67 | Temp 98.7°F | Ht 69.5 in | Wt 148.2 lb

## 2023-10-31 DIAGNOSIS — E876 Hypokalemia: Secondary | ICD-10-CM

## 2023-10-31 DIAGNOSIS — I10 Essential (primary) hypertension: Secondary | ICD-10-CM | POA: Diagnosis not present

## 2023-10-31 DIAGNOSIS — E538 Deficiency of other specified B group vitamins: Secondary | ICD-10-CM | POA: Diagnosis not present

## 2023-10-31 DIAGNOSIS — N400 Enlarged prostate without lower urinary tract symptoms: Secondary | ICD-10-CM

## 2023-10-31 DIAGNOSIS — J432 Centrilobular emphysema: Secondary | ICD-10-CM

## 2023-10-31 DIAGNOSIS — R7689 Other specified abnormal immunological findings in serum: Secondary | ICD-10-CM

## 2023-10-31 DIAGNOSIS — I700 Atherosclerosis of aorta: Secondary | ICD-10-CM

## 2023-10-31 DIAGNOSIS — E559 Vitamin D deficiency, unspecified: Secondary | ICD-10-CM

## 2023-10-31 DIAGNOSIS — E782 Mixed hyperlipidemia: Secondary | ICD-10-CM

## 2023-10-31 DIAGNOSIS — R768 Other specified abnormal immunological findings in serum: Secondary | ICD-10-CM

## 2023-10-31 DIAGNOSIS — F17210 Nicotine dependence, cigarettes, uncomplicated: Secondary | ICD-10-CM

## 2023-10-31 MED ORDER — NIFEDIPINE ER OSMOTIC RELEASE 30 MG PO TB24: 30.0000 mg | ORAL_TABLET | Freq: Every day | ORAL | 1 refills | Status: DC

## 2023-10-31 MED ORDER — DILTIAZEM HCL ER 60 MG PO CP12: 60.0000 mg | ORAL_CAPSULE | Freq: Every day | ORAL | 2 refills | Status: DC

## 2023-10-31 NOTE — Assessment & Plan Note (Addendum)
 Chronic. Check levels today and continue supplement as needed.

## 2023-10-31 NOTE — Progress Notes (Signed)
 BP (!) 158/90 (BP Location: Left Arm, Patient Position: Sitting, Cuff Size: Normal)   Pulse 67   Temp 98.7 F (37.1 C) (Oral)   Ht 5' 9.5" (1.765 m)   Wt 148 lb 3.2 oz (67.2 kg)   SpO2 96%   BMI 21.57 kg/m    Subjective:    Patient ID: Brian Walker, male    DOB: 06-May-1961, 63 y.o.   MRN: [id number]  HPI: Brian Walker is a 63 y.o. male  Chief Complaint  Patient presents with   Hyperlipidemia   Hypertension   Pain   HYPERTENSION / HYPERLIPIDEMIA Taking Valsartan, Spironolactone, K+, and Rosuvastatin. Took Amlodipine in past which caused fatigue and to feel ill. Taking K+ supplement due to history of low levels, even with current BP medications.  He endorses taking medication daily.  Continues to have major stressors with wife.   Continues to smoke.  No alcohol or drug use.  Underlying emphysema. Satisfied with current treatment? yes Duration of hypertension: chronic BP monitoring frequency: a few times a week BP range: 150-170/90 range BP medication side effects: no Duration of hyperlipidemia: chronic Cholesterol medication side effects: no Cholesterol supplements: none Medication compliance: good compliance Aspirin: no Recent stressors: yes Recurrent headaches: no Visual changes: no Palpitations: no Dyspnea: no Chest pain: no Lower extremity edema: no Dizzy/lightheaded: no   JOINT PAIN  Positive ANA with speckled pattern 1:640, noted on past labs.  Has chronic pain to both hands and all over, ongoing for 5-6 years. Even simple movements and tasks can cause him pain. Gets very stiff often. If moves awhile will finally get lose. His sister has lupus, states this started after she got a swine flu shot. Reports pain is livable.  Takes B12 and Vitamin D for low levels. Pain control status: stable Duration: chronic Location: hands, knees, fingers, feet, back Quality: dull, aching, and throbbing Current Pain Level: 3/10 Previous Pain Level: 6/10 Previous pain  specialty evaluation: no Non-narcotic analgesic meds: occasional Narcotic contract: no   Relevant past medical, surgical, family and social history reviewed and updated as indicated. Interim medical history since our last visit reviewed. Allergies and medications reviewed and updated.  Review of Systems  Constitutional:  Negative for activity change, diaphoresis, fatigue and fever.  Respiratory:  Negative for cough, chest tightness, shortness of breath and wheezing.   Cardiovascular:  Negative for chest pain, palpitations and leg swelling.  Gastrointestinal: Negative.   Musculoskeletal:  Positive for arthralgias.  Neurological: Negative.   Psychiatric/Behavioral: Negative.      Per HPI unless specifically indicated above     Objective:    BP (!) 158/90 (BP Location: Left Arm, Patient Position: Sitting, Cuff Size: Normal)   Pulse 67   Temp 98.7 F (37.1 C) (Oral)   Ht 5' 9.5" (1.765 m)   Wt 148 lb 3.2 oz (67.2 kg)   SpO2 96%   BMI 21.57 kg/m   Wt Readings from Last 3 Encounters:  10/31/23 148 lb 3.2 oz (67.2 kg)  09/25/23 150 lb 6.4 oz (68.2 kg)  08/29/23 145 lb 3.2 oz (65.9 kg)    Physical Exam Vitals and nursing note reviewed.  Constitutional:      General: He is awake. He is not in acute distress.    Appearance: He is well-developed and well-groomed. He is not ill-appearing or toxic-appearing.  HENT:     Head: Normocephalic.  Eyes:     General: Lids are normal.     Extraocular Movements: Extraocular movements  intact.     Conjunctiva/sclera: Conjunctivae normal.  Neck:     Thyroid: No thyromegaly.     Vascular: No carotid bruit.  Cardiovascular:     Rate and Rhythm: Normal rate and regular rhythm.     Heart sounds: Normal heart sounds.  Pulmonary:     Effort: No accessory muscle usage or respiratory distress.     Breath sounds: Normal breath sounds.  Abdominal:     General: Bowel sounds are normal. There is no distension.     Palpations: Abdomen is soft.      Tenderness: There is no abdominal tenderness.  Musculoskeletal:     Right wrist: Normal.     Left wrist: Normal.     Right hand: Swelling (to some of the fingers) present. No tenderness. Decreased range of motion. Normal strength. Normal sensation. Normal pulse.     Left hand: Swelling (to some of the fingers) present. No tenderness. Decreased range of motion. Normal strength. Normal sensation. Normal pulse.     Cervical back: Full passive range of motion without pain.     Thoracic back: Normal.     Lumbar back: Normal.     Right lower leg: No edema.     Left lower leg: No edema.  Lymphadenopathy:     Cervical: No cervical adenopathy.  Skin:    General: Skin is warm.     Capillary Refill: Capillary refill takes less than 2 seconds.  Neurological:     Mental Status: He is alert and oriented to person, place, and time.     Deep Tendon Reflexes: Reflexes are normal and symmetric.     Reflex Scores:      Brachioradialis reflexes are 2+ on the right side and 2+ on the left side.      Patellar reflexes are 2+ on the right side and 2+ on the left side. Psychiatric:        Attention and Perception: Attention normal.        Mood and Affect: Mood normal.        Speech: Speech normal.        Behavior: Behavior normal. Behavior is cooperative.        Thought Content: Thought content normal.     Results for orders placed or performed in visit on 09/25/23  Basic metabolic panel   Collection Time: 09/25/23  3:07 PM  Result Value Ref Range   Glucose 70 70 - 99 mg/dL   BUN 12 8 - 27 mg/dL   Creatinine, Ser 8.65 0.76 - 1.27 mg/dL   eGFR 76 >78 IO/NGE/9.52   BUN/Creatinine Ratio 11 10 - 24   Sodium 142 134 - 144 mmol/L   Potassium 3.4 (L) 3.5 - 5.2 mmol/L   Chloride 102 96 - 106 mmol/L   CO2 25 20 - 29 mmol/L   Calcium 10.0 8.6 - 10.2 mg/dL  CBC with Differential/Platelet   Collection Time: 09/25/23  3:07 PM  Result Value Ref Range   WBC 8.6 3.4 - 10.8 x10E3/uL   RBC 5.00 4.14 - 5.80  x10E6/uL   Hemoglobin 15.1 13.0 - 17.7 g/dL   Hematocrit 84.1 32.4 - 51.0 %   MCV 91 79 - 97 fL   MCH 30.2 26.6 - 33.0 pg   MCHC 33.2 31.5 - 35.7 g/dL   RDW 40.1 02.7 - 25.3 %   Platelets 279 150 - 450 x10E3/uL   Neutrophils 59 Not Estab. %   Lymphs 31 Not Estab. %   Monocytes  8 Not Estab. %   Eos 1 Not Estab. %   Basos 1 Not Estab. %   Neutrophils Absolute 5.1 1.4 - 7.0 x10E3/uL   Lymphocytes Absolute 2.6 0.7 - 3.1 x10E3/uL   Monocytes Absolute 0.6 0.1 - 0.9 x10E3/uL   EOS (ABSOLUTE) 0.1 0.0 - 0.4 x10E3/uL   Basophils Absolute 0.1 0.0 - 0.2 x10E3/uL   Immature Granulocytes 0 Not Estab. %   Immature Grans (Abs) 0.0 0.0 - 0.1 x10E3/uL  Uric acid   Collection Time: 09/25/23  3:07 PM  Result Value Ref Range   Uric Acid 4.6 3.8 - 8.4 mg/dL  Magnesium   Collection Time: 09/25/23  3:07 PM  Result Value Ref Range   Magnesium 1.8 1.6 - 2.3 mg/dL  C-reactive protein   Collection Time: 09/25/23  3:07 PM  Result Value Ref Range   CRP <1 0 - 10 mg/L  Sed Rate (ESR)   Collection Time: 09/25/23  3:07 PM  Result Value Ref Range   Sed Rate 6 0 - 30 mm/hr  ANA 12 Plus Profile (RDL)   Collection Time: 09/25/23  3:07 PM  Result Value Ref Range   Anti-Nuclear Ab by IFA (RDL) Positive (A) Negative  ANA 12 Plus Profile, Positive   Collection Time: 09/25/23  3:07 PM  Result Value Ref Range   Speckled Pattern 1:640 (H) <1:40   Note: Comment    Anti-Centromere Ab (RDL) <1:40 <1:40   Anti-dsDNA Ab by Farr(RDL) CANCELED IU/mL   Anti-Sm Ab (RDL) <20 <20 Units   Anti-U1 RNP Ab (RDL) <20 <20 Units   Anti-Ro (SS-A) Ab (RDL) <20 <20 Units   Anti-La (SS-B) Ab (RDL) <20 <20 Units   Anti-Scl-70 Ab (RDL) <20 <20 Units   Anti-Cardiolipin Ab, IgG (RDL) CANCELED GPL U/mL   Anti-Cardiolipin Ab, IgA (RDL) CANCELED APL U/mL   Anti-Cardiolipin Ab, IgM (RDL) CANCELED MPL U/mL   C3 Complement (RDL) CANCELED mg/dL   C4 Complement (RDL) CANCELED mg/dL   Anti-TPO Ab (RDL) CANCELED IU/mL   Anti-Chromatin  Ab, IgG (RDL) CANCELED Units   Anti-CCP Ab, IgG & IgA (RDL) CANCELED Units   Rheumatoid Factor by Turb RDL CANCELED IU/mL   ANA Plus 12 Interpretation Comment   Status Report   Collection Time: 09/25/23  3:07 PM  Result Value Ref Range   Status Report WILL FOLLOW       Assessment & Plan:   Problem List Items Addressed This Visit       Cardiovascular and Mediastinum   Aortic atherosclerosis (HCC) - Primary   Noted on lung screening 02/10/23, discussed with him today and educated him on finding.  Continue Rosuvastatin daily and recommend a daily Baby ASA.      Relevant Medications   NIFEdipine (PROCARDIA-XL/NIFEDICAL-XL) 30 MG 24 hr tablet   Other Relevant Orders   Comprehensive metabolic panel with GFR   Lipid Panel w/o Chol/HDL Ratio   Essential hypertension   Chronic, with some increase due to recent cold medications.  Will continue Valsartan 320 MG and Spironolactone 37.5 MG daily, having no ADR with either. Prefer Spironolactone due to his long history of fluctuating low K+ levels, concern for thiazide due to risk for lowering K+ levels. Had fatigue with Amlodipine in past, but ?whether this is related more to home stressors. - Will trial a low dose of Nifedipine XL 30 MG daily to start, we had discussed at past visits and further discussed today. - Recommend he monitor BP at least a few mornings a  week at home and document.   - DASH diet at home.  Labs today: CBC, CMP .   - Lengthy discussion with him today about taking medications as ordered and his current high risk for stroke -- offered a SW referral for therapy due to home stressors.  He refuses.      Relevant Medications   NIFEdipine (PROCARDIA-XL/NIFEDICAL-XL) 30 MG 24 hr tablet   Other Relevant Orders   CBC with Differential/Platelet   Comprehensive metabolic panel with GFR   TSH     Other   ANA positive   Noted on recent labs, but unable to complete full panel due to not enough blood.  Speckled pattern was 1:640.   Sister with lupus.  ?lupus in this patient, does have BP that has been difficult to get under control.  Will place referral to rheumatology.  Repeat ANA panel and check Rf, CRP, ESR.  Discussed with patient.      Relevant Orders   ANA 12 Plus Profile (RDL)   C-reactive protein   Rheumatoid factor   Sedimentation rate   Ambulatory referral to Rheumatology   Hypokalemia   History of fluctuating levels since 2017 with lows occasionally presenting.  Recheck today.  Avoid medications that lower K+ levels.  Has supplement to take.      Relevant Orders   Comprehensive metabolic panel with GFR   Mixed hyperlipidemia   Chronic, ongoing.  Continue current medication regimen and adjust as needed.  Lipid panel today.      Relevant Medications   NIFEdipine (PROCARDIA-XL/NIFEDICAL-XL) 30 MG 24 hr tablet   Other Relevant Orders   Comprehensive metabolic panel with GFR   Lipid Panel w/o Chol/HDL Ratio   Nicotine dependence, cigarettes, uncomplicated   I have recommended complete cessation of tobacco use. I have discussed various options available for assistance with tobacco cessation including over the counter methods (Nicotine gum, patch and lozenges). We also discussed prescription options (Chantix, Nicotine Inhaler / Nasal Spray). The patient is not interested in pursuing any prescription tobacco cessation options at this time.       Vitamin B12 deficiency   Chronic. Check levels today and continue supplement as needed.      Relevant Orders   Vitamin B12   Vitamin D deficiency   Chronic. Check levels today and continue supplement as needed.      Relevant Orders   VITAMIN D 25 Hydroxy (Vit-D Deficiency, Fractures)   Other Visit Diagnoses       Benign prostatic hyperplasia without lower urinary tract symptoms       PSA on labs today.   Relevant Orders   PSA        Follow up plan: Return in about 4 weeks (around 11/28/2023) for HTN/HLD.

## 2023-10-31 NOTE — Assessment & Plan Note (Signed)
 I have recommended complete cessation of tobacco use. I have discussed various options available for assistance with tobacco cessation including over the counter methods (Nicotine gum, patch and lozenges). We also discussed prescription options (Chantix, Nicotine Inhaler / Nasal Spray). The patient is not interested in pursuing any prescription tobacco cessation options at this time.

## 2023-10-31 NOTE — Assessment & Plan Note (Signed)
Noted on lung screening 02/10/23, discussed with him today and educated him on finding.  Continue Rosuvastatin daily and recommend a daily Baby ASA.

## 2023-10-31 NOTE — Assessment & Plan Note (Signed)
 Chronic, with some increase due to recent cold medications.  Will continue Valsartan 320 MG and Spironolactone 37.5 MG daily, having no ADR with either. Prefer Spironolactone due to his long history of fluctuating low K+ levels, concern for thiazide due to risk for lowering K+ levels. Had fatigue with Amlodipine in past, but ?whether this is related more to home stressors. - Will trial a low dose of Nifedipine XL 30 MG daily to start, we had discussed at past visits and further discussed today. - Recommend he monitor BP at least a few mornings a week at home and document.   - DASH diet at home.  Labs today: CBC, CMP .   - Lengthy discussion with him today about taking medications as ordered and his current high risk for stroke -- offered a SW referral for therapy due to home stressors.  He refuses.

## 2023-10-31 NOTE — Assessment & Plan Note (Signed)
 History of fluctuating levels since 2017 with lows occasionally presenting.  Recheck today.  Avoid medications that lower K+ levels.  Has supplement to take.

## 2023-10-31 NOTE — Assessment & Plan Note (Addendum)
 Noted on recent labs, but unable to complete full panel due to not enough blood.  Speckled pattern was 1:640.  Sister with lupus.  ?lupus in this patient, does have BP that has been difficult to get under control.  Will place referral to rheumatology.  Repeat ANA panel and check Rf, CRP, ESR.  Discussed with patient.

## 2023-10-31 NOTE — Assessment & Plan Note (Signed)
 Chronic, ongoing.  Continue current medication regimen and adjust as needed. Lipid panel today.

## 2023-11-01 ENCOUNTER — Encounter: Payer: Self-pay | Admitting: Nurse Practitioner

## 2023-11-01 NOTE — Progress Notes (Signed)
 Contacted via MyChart   Good morning Brian Walker, your labs are returning: - CBC overall stable with no anemia or infection. - Kidney function, creatinine and eGFR, remains normal, as is liver function, AST and ALT.  - LDL a little more elevated on labs this check, please ensure to take your Rosuvastatin daily. - Thyroid and Prostate levels normal. - Vitamin D normal, but B12 remains low.  Please ensure to take Vitamin B12 1000 MCG daily for nervous system health. - Waiting on ANA, but inflammatory markers and rheumatoid factor normal.  Any questions? Keep being amazing!!  Thank you for allowing me to participate in your care.  I appreciate you. Kindest regards, Mahati Vajda

## 2023-11-08 LAB — ANA 12 PLUS PROFILE (RDL): Anti-Nuclear Ab by IFA (RDL): POSITIVE — AB

## 2023-11-08 LAB — TSH: TSH: 1.03 u[IU]/mL (ref 0.450–4.500)

## 2023-11-08 LAB — ANA 12 PLUS PROFILE, POSITIVE
Anti-Centromere Ab (RDL): <1:40 {titer}
C3 Complement (RDL): 140 mg/dL (ref 90–180)
C4 Complement (RDL): 28 mg/dL (ref 10–40)
Speckled Pattern: 1:160 {titer} — ABNORMAL HIGH

## 2023-11-08 LAB — CBC WITH DIFFERENTIAL/PLATELET
Basophils Absolute: 0.1 10*3/uL (ref 0.0–0.2)
Basos: 2 %
EOS (ABSOLUTE): 0.2 10*3/uL (ref 0.0–0.4)
Eos: 2 %
Hematocrit: 51.8 % — ABNORMAL HIGH (ref 37.5–51.0)
Hemoglobin: 17.2 g/dL (ref 13.0–17.7)
Immature Grans (Abs): 0 10*3/uL (ref 0.0–0.1)
Immature Granulocytes: 0 %
Lymphocytes Absolute: 2.3 10*3/uL (ref 0.7–3.1)
Lymphs: 31 %
MCH: 30.3 pg (ref 26.6–33.0)
MCHC: 33.2 g/dL (ref 31.5–35.7)
MCV: 91 fL (ref 79–97)
Monocytes Absolute: 0.5 10*3/uL (ref 0.1–0.9)
Monocytes: 7 %
Neutrophils Absolute: 4.4 10*3/uL (ref 1.4–7.0)
Neutrophils: 58 %
Platelets: 291 10*3/uL (ref 150–450)
RBC: 5.67 x10E6/uL (ref 4.14–5.80)
RDW: 14.4 % (ref 11.6–15.4)
WBC: 7.5 10*3/uL (ref 3.4–10.8)

## 2023-11-08 LAB — SEDIMENTATION RATE: Sed Rate: 8 mm/h (ref 0–30)

## 2023-11-08 LAB — COMPREHENSIVE METABOLIC PANEL WITH GFR
ALT: 10 IU/L (ref 0–44)
AST: 17 IU/L (ref 0–40)
Albumin: 4.2 g/dL (ref 3.9–4.9)
Alkaline Phosphatase: 94 IU/L (ref 44–121)
BUN/Creatinine Ratio: 9 — ABNORMAL LOW (ref 10–24)
BUN: 9 mg/dL (ref 8–27)
Bilirubin Total: 0.4 mg/dL (ref 0.0–1.2)
CO2: 26 mmol/L (ref 20–29)
Calcium: 9.9 mg/dL (ref 8.6–10.2)
Chloride: 101 mmol/L (ref 96–106)
Creatinine, Ser: 0.99 mg/dL (ref 0.76–1.27)
Globulin, Total: 2.6 g/dL (ref 1.5–4.5)
Glucose: 101 mg/dL — ABNORMAL HIGH (ref 70–99)
Potassium: 3.5 mmol/L (ref 3.5–5.2)
Sodium: 141 mmol/L (ref 134–144)
Total Protein: 6.8 g/dL (ref 6.0–8.5)
eGFR: 85 mL/min/{1.73_m2} (ref >59)

## 2023-11-08 LAB — C-REACTIVE PROTEIN: CRP: 1 mg/L (ref 0–10)

## 2023-11-08 LAB — LIPID PANEL W/O CHOL/HDL RATIO
Cholesterol, Total: 176 mg/dL (ref 100–199)
HDL: 55 mg/dL (ref >39)
LDL Chol Calc (NIH): 100 mg/dL — ABNORMAL HIGH (ref 0–99)
Triglycerides: 121 mg/dL (ref 0–149)
VLDL Cholesterol Cal: 21 mg/dL (ref 5–40)

## 2023-11-08 LAB — VITAMIN D 25 HYDROXY (VIT D DEFICIENCY, FRACTURES): Vit D, 25-Hydroxy: 45.9 ng/mL (ref 30.0–100.0)

## 2023-11-08 LAB — RHEUMATOID FACTOR: Rheumatoid fact SerPl-aCnc: 10.1 [IU]/mL (ref <14.0)

## 2023-11-08 LAB — VITAMIN B12: Vitamin B-12: 241 pg/mL (ref 232–1245)

## 2023-11-08 LAB — PSA: Prostate Specific Ag, Serum: 2.5 ng/mL (ref 0.0–4.0)

## 2023-11-27 ENCOUNTER — Encounter: Payer: Self-pay | Admitting: Nurse Practitioner

## 2023-11-29 ENCOUNTER — Encounter (HOSPITAL_COMMUNITY): Payer: Self-pay

## 2023-12-01 ENCOUNTER — Ambulatory Visit: Admitting: Nurse Practitioner

## 2023-12-02 NOTE — Patient Instructions (Incomplete)

## 2023-12-06 ENCOUNTER — Ambulatory Visit (INDEPENDENT_AMBULATORY_CARE_PROVIDER_SITE_OTHER): Admitting: Nurse Practitioner

## 2023-12-06 ENCOUNTER — Encounter: Payer: Self-pay | Admitting: Nurse Practitioner

## 2023-12-06 VITALS — BP 158/98 | HR 66 | Temp 97.6°F | Ht 69.5 in | Wt 147.0 lb

## 2023-12-06 DIAGNOSIS — Z8269 Family history of other diseases of the musculoskeletal system and connective tissue: Secondary | ICD-10-CM | POA: Insufficient documentation

## 2023-12-06 DIAGNOSIS — E782 Mixed hyperlipidemia: Secondary | ICD-10-CM | POA: Diagnosis not present

## 2023-12-06 DIAGNOSIS — E876 Hypokalemia: Secondary | ICD-10-CM

## 2023-12-06 DIAGNOSIS — I10 Essential (primary) hypertension: Secondary | ICD-10-CM | POA: Diagnosis not present

## 2023-12-06 DIAGNOSIS — I1A0 Resistant hypertension: Secondary | ICD-10-CM

## 2023-12-06 DIAGNOSIS — R768 Other specified abnormal immunological findings in serum: Secondary | ICD-10-CM | POA: Diagnosis not present

## 2023-12-06 DIAGNOSIS — J432 Centrilobular emphysema: Secondary | ICD-10-CM | POA: Diagnosis not present

## 2023-12-06 DIAGNOSIS — F17210 Nicotine dependence, cigarettes, uncomplicated: Secondary | ICD-10-CM

## 2023-12-06 MED ORDER — VALSARTAN 320 MG PO TABS: 320.0000 mg | ORAL_TABLET | Freq: Every day | ORAL | 4 refills | Status: DC

## 2023-12-06 MED ORDER — ROSUVASTATIN CALCIUM 5 MG PO TABS: 5.0000 mg | ORAL_TABLET | Freq: Every day | ORAL | 4 refills | Status: AC

## 2023-12-06 MED ORDER — SPIRONOLACTONE 25 MG PO TABS: 37.5000 mg | ORAL_TABLET | Freq: Every day | ORAL | 4 refills | Status: AC

## 2023-12-06 MED ORDER — NIFEDIPINE ER OSMOTIC RELEASE 60 MG PO TB24: 60.0000 mg | ORAL_TABLET | Freq: Every day | ORAL | 0 refills | Status: DC

## 2023-12-06 NOTE — Assessment & Plan Note (Signed)
Refer to ANA plan of care.

## 2023-12-06 NOTE — Assessment & Plan Note (Signed)
 I have recommended complete cessation of tobacco use. I have discussed various options available for assistance with tobacco cessation including over the counter methods (Nicotine gum, patch and lozenges). We also discussed prescription options (Chantix, Nicotine Inhaler / Nasal Spray). The patient is not interested in pursuing any prescription tobacco cessation options at this time.

## 2023-12-06 NOTE — Assessment & Plan Note (Signed)
 Chronic, ongoing.  He reports consistently taking medication, but based on review unsure he is.  Will continue Valsartan  320 MG and Spironolactone  37.5 MG daily, having no ADR with either. Prefer Spironolactone  due to his long history of fluctuating low K+ levels, concern for thiazide due to risk for lowering K+ levels. Had fatigue with Amlodipine  in past, but ?whether this is related more to home stressors. - Increase Nifedipine  XL 60 MG daily, we had discussed at past visits and further discussed today. - Recommend he monitor BP at least a few mornings a week at home and document.   - DASH diet at home.  Labs today: BMP .   - Lengthy discussion with him today about taking medications as ordered and his current high risk for stroke -- offered a SW referral for therapy due to home stressors.  He refuses. - Will obtain renal ultrasound and echo to further assess.  Unsure if resistant vs poor medication adherence.  May need HTN clinic in future if ongoing elevations.

## 2023-12-06 NOTE — Assessment & Plan Note (Signed)
 Chronic, ongoing.  Continue current medication regimen and adjust as needed. Lipid panel today.

## 2023-12-06 NOTE — Assessment & Plan Note (Signed)
 Noted on recent labs, speckled pattern 1:160 and 1:640 on checks. Sister with lupus.  ?lupus in this patient, does have BP that has been difficult to get under control.  Recommend he reschedule with rheumatology.  Will check antiphospholipid.  Discussed with patient.

## 2023-12-06 NOTE — Assessment & Plan Note (Signed)
 History of fluctuating levels since 2017 with lows occasionally presenting.  Recheck today.  Avoid medications that lower K+ levels.  Restart supplement as needed.  Currently takes Spironolactone  and Valsartan  which appear to be helping levels.

## 2023-12-06 NOTE — Assessment & Plan Note (Signed)
 Chronic, noted on lung screening 02/10/23.  No current inhalers.  Will obtain spirometry at future visit when feeling better.  Recommend complete cessation of smoking.  Continue annual lung screening.  Inhalers as needed in future.  Check Alpha 1.

## 2023-12-06 NOTE — Progress Notes (Signed)
 BP (!) 158/98 (BP Location: Left Arm, Patient Position: Sitting, Cuff Size: Normal)   Pulse 66   Temp 97.6 F (36.4 C) (Oral)   Ht 5' 9.5" (1.765 m)   Wt 147 lb (66.7 kg)   SpO2 98%   BMI 21.40 kg/m    Subjective:    Patient ID: Brian Walker, male    DOB: Nov 07, 1960, 63 y.o.   MRN: [id number]  HPI: Brian Walker is a 63 y.o. male  Chief Complaint  Patient presents with   Hyperlipidemia   Hypertension   HYPERTENSION / HYPERLIPIDEMIA To be taking Valsartan , Spironolactone  (does notice some occasional left breast pain since starting), K+, and Rosuvastatin . At last visit started Nifedipine  which he is tolerating per report.  Unsure if he is consistently taking medications based on review, but he reports he is.  Took Amlodipine  in past which caused fatigue and to feel ill. Taking K+ supplement due to history of low levels, even with current BP medications on board.  Continues to have major stressors with wife and "really weird month" as child has been sick a lot. Was down to 130/80 range last week, but this week has been a stressful week.   Continues to smoke, underlying emphysema.  Smokes 1/2 PPD, down some. No alcohol or drug use.   Satisfied with current treatment? yes Duration of hypertension: chronic BP monitoring frequency: a few times a week BP range: 150-170/90 range BP medication side effects: no Duration of hyperlipidemia: chronic Cholesterol medication side effects: no Cholesterol supplements: none Medication compliance: good compliance Aspirin: no Recent stressors: yes Recurrent headaches: no Visual changes: no Palpitations: no Dyspnea: no Chest pain: no Lower extremity edema: no Dizzy/lightheaded: no   JOINT PAIN  Positive ANA with speckled pattern 1:640 to 1:160, noted on past labs. This week has been exceptionally bad with the rain he reports, whole body pain -- hands with swelling at times. Even simple movements and tasks can cause him pain and has  constant stiffness.  If moves awhile will finally get lose. His sister has lupus, states this started after she got a swine flu shot. He was scheduled to see rheumatology, but reports he had to cancel and will need to reschedule. Pain control status: stable Duration: chronic Location: hands, knees, fingers, feet, back Quality: dull, aching, and throbbing Current Pain Level: 7/10 today, is the worst day Previous Pain Level: 4/10 Previous pain specialty evaluation: no Non-narcotic analgesic meds: occasional Narcotic contract: no   Relevant past medical, surgical, family and social history reviewed and updated as indicated. Interim medical history since our last visit reviewed. Allergies and medications reviewed and updated.  Review of Systems  Constitutional:  Negative for activity change, diaphoresis, fatigue and fever.  Respiratory:  Negative for cough, chest tightness, shortness of breath and wheezing.   Cardiovascular:  Negative for chest pain, palpitations and leg swelling.  Gastrointestinal: Negative.   Musculoskeletal:  Positive for arthralgias.  Neurological: Negative.   Psychiatric/Behavioral: Negative.     Per HPI unless specifically indicated above     Objective:    BP (!) 158/98 (BP Location: Left Arm, Patient Position: Sitting, Cuff Size: Normal)   Pulse 66   Temp 97.6 F (36.4 C) (Oral)   Ht 5' 9.5" (1.765 m)   Wt 147 lb (66.7 kg)   SpO2 98%   BMI 21.40 kg/m   Wt Readings from Last 3 Encounters:  12/06/23 147 lb (66.7 kg)  10/31/23 148 lb 3.2 oz (67.2 kg)  09/25/23 150 lb 6.4 oz (68.2 kg)    Physical Exam Vitals and nursing note reviewed.  Constitutional:      General: He is awake. He is not in acute distress.    Appearance: He is well-developed and well-groomed. He is not ill-appearing or toxic-appearing.  HENT:     Head: Normocephalic.  Eyes:     General: Lids are normal.     Extraocular Movements: Extraocular movements intact.     Conjunctiva/sclera:  Conjunctivae normal.  Neck:     Thyroid : No thyromegaly.     Vascular: No carotid bruit.  Cardiovascular:     Rate and Rhythm: Normal rate and regular rhythm.     Heart sounds: Normal heart sounds.  Pulmonary:     Effort: No accessory muscle usage or respiratory distress.     Breath sounds: Normal breath sounds.  Abdominal:     General: Bowel sounds are normal. There is no distension.     Palpations: Abdomen is soft.     Tenderness: There is no abdominal tenderness.  Musculoskeletal:     Right wrist: Normal.     Left wrist: Normal.     Right hand: Swelling (to some of the fingers) present. No tenderness. Decreased range of motion. Normal strength. Normal sensation. Normal pulse.     Left hand: Swelling (to some of the fingers) present. No tenderness. Decreased range of motion. Normal strength. Normal sensation. Normal pulse.     Cervical back: Full passive range of motion without pain.     Thoracic back: Normal.     Lumbar back: Normal.     Right lower leg: No edema.     Left lower leg: No edema.  Lymphadenopathy:     Cervical: No cervical adenopathy.  Skin:    General: Skin is warm.     Capillary Refill: Capillary refill takes less than 2 seconds.  Neurological:     Mental Status: He is alert and oriented to person, place, and time.     Deep Tendon Reflexes: Reflexes are normal and symmetric.     Reflex Scores:      Brachioradialis reflexes are 2+ on the right side and 2+ on the left side.      Patellar reflexes are 2+ on the right side and 2+ on the left side. Psychiatric:        Attention and Perception: Attention normal.        Mood and Affect: Mood normal.        Speech: Speech normal.        Behavior: Behavior normal. Behavior is cooperative.        Thought Content: Thought content normal.     Results for orders placed or performed in visit on 10/31/23  CBC with Differential/Platelet   Collection Time: 10/31/23  9:17 AM  Result Value Ref Range   WBC 7.5 3.4 -  10.8 x10E3/uL   RBC 5.67 4.14 - 5.80 x10E6/uL   Hemoglobin 17.2 13.0 - 17.7 g/dL   Hematocrit 16.1 (H) 09.6 - 51.0 %   MCV 91 79 - 97 fL   MCH 30.3 26.6 - 33.0 pg   MCHC 33.2 31.5 - 35.7 g/dL   RDW 04.5 40.9 - 81.1 %   Platelets 291 150 - 450 x10E3/uL   Neutrophils 58 Not Estab. %   Lymphs 31 Not Estab. %   Monocytes 7 Not Estab. %   Eos 2 Not Estab. %   Basos 2 Not Estab. %   Neutrophils  Absolute 4.4 1.4 - 7.0 x10E3/uL   Lymphocytes Absolute 2.3 0.7 - 3.1 x10E3/uL   Monocytes Absolute 0.5 0.1 - 0.9 x10E3/uL   EOS (ABSOLUTE) 0.2 0.0 - 0.4 x10E3/uL   Basophils Absolute 0.1 0.0 - 0.2 x10E3/uL   Immature Granulocytes 0 Not Estab. %   Immature Grans (Abs) 0.0 0.0 - 0.1 x10E3/uL  Comprehensive metabolic panel with GFR   Collection Time: 10/31/23  9:17 AM  Result Value Ref Range   Glucose 101 (H) 70 - 99 mg/dL   BUN 9 8 - 27 mg/dL   Creatinine, Ser 1.61 0.76 - 1.27 mg/dL   eGFR 85 >09 UE/AVW/0.98   BUN/Creatinine Ratio 9 (L) 10 - 24   Sodium 141 134 - 144 mmol/L   Potassium 3.5 3.5 - 5.2 mmol/L   Chloride 101 96 - 106 mmol/L   CO2 26 20 - 29 mmol/L   Calcium  9.9 8.6 - 10.2 mg/dL   Total Protein 6.8 6.0 - 8.5 g/dL   Albumin 4.2 3.9 - 4.9 g/dL   Globulin, Total 2.6 1.5 - 4.5 g/dL   Bilirubin Total 0.4 0.0 - 1.2 mg/dL   Alkaline Phosphatase 94 44 - 121 IU/L   AST 17 0 - 40 IU/L   ALT 10 0 - 44 IU/L  Lipid Panel w/o Chol/HDL Ratio   Collection Time: 10/31/23  9:17 AM  Result Value Ref Range   Cholesterol, Total 176 100 - 199 mg/dL   Triglycerides 119 0 - 149 mg/dL   HDL 55 >14 mg/dL   VLDL Cholesterol Cal 21 5 - 40 mg/dL   LDL Chol Calc (NIH) 782 (H) 0 - 99 mg/dL  TSH   Collection Time: 10/31/23  9:17 AM  Result Value Ref Range   TSH 1.030 0.450 - 4.500 uIU/mL  PSA   Collection Time: 10/31/23  9:17 AM  Result Value Ref Range   Prostate Specific Ag, Serum 2.5 0.0 - 4.0 ng/mL  VITAMIN D  25 Hydroxy (Vit-D Deficiency, Fractures)   Collection Time: 10/31/23  9:17 AM   Result Value Ref Range   Vit D, 25-Hydroxy 45.9 30.0 - 100.0 ng/mL  Vitamin B12   Collection Time: 10/31/23  9:17 AM  Result Value Ref Range   Vitamin B-12 241 232 - 1,245 pg/mL  ANA 12 Plus Profile (RDL)   Collection Time: 10/31/23  9:17 AM  Result Value Ref Range   Anti-Nuclear Ab by IFA (RDL) Positive (A) Negative  C-reactive protein   Collection Time: 10/31/23  9:17 AM  Result Value Ref Range   CRP 1 0 - 10 mg/L  Rheumatoid factor   Collection Time: 10/31/23  9:17 AM  Result Value Ref Range   Rheumatoid fact SerPl-aCnc 10.1 <14.0 IU/mL  Sedimentation rate   Collection Time: 10/31/23  9:17 AM  Result Value Ref Range   Sed Rate 8 0 - 30 mm/hr  ANA 12 Plus Profile, Positive   Collection Time: 10/31/23  9:17 AM  Result Value Ref Range   Speckled Pattern 1:160 (H) <1:40   Note: Comment    Anti-Centromere Ab (RDL) <1:40 <1:40   Anti-dsDNA Ab by Farr(RDL) <8.0 <8.0 IU/mL   Anti-Sm Ab (RDL) <20 <20 Units   Anti-U1 RNP Ab (RDL) <20 <20 Units   Anti-Ro (SS-A) Ab (RDL) <20 <20 Units   Anti-La (SS-B) Ab (RDL) <20 <20 Units   Anti-Scl-70 Ab (RDL) <20 <20 Units   Anti-Cardiolipin Ab, IgG (RDL) <15 <15 GPL U/mL   Anti-Cardiolipin Ab, IgA (RDL) <12 <12  APL U/mL   Anti-Cardiolipin Ab, IgM (RDL) <13 <13 MPL U/mL   C3 Complement (RDL) 140 90 - 180 mg/dL   C4 Complement (RDL) 28 10 - 40 mg/dL   Anti-TPO Ab (RDL) <1.6 <9.0 IU/mL   Anti-Chromatin Ab, IgG (RDL) <20 <20 Units   Anti-CCP Ab, IgG & IgA (RDL) <20 <20 Units   Rheumatoid Factor by Turb RDL CANCELED IU/mL   ANA Plus 12 Interpretation Comment       Assessment & Plan:   Problem List Items Addressed This Visit       Cardiovascular and Mediastinum   Resistant hypertension   Chronic, ongoing.  He reports consistently taking medication, but based on review unsure he is.  Will continue Valsartan  320 MG and Spironolactone  37.5 MG daily, having no ADR with either. Prefer Spironolactone  due to his long history of fluctuating low  K+ levels, concern for thiazide due to risk for lowering K+ levels. Had fatigue with Amlodipine  in past, but ?whether this is related more to home stressors. - Increase Nifedipine  XL 60 MG daily, we had discussed at past visits and further discussed today. - Recommend he monitor BP at least a few mornings a week at home and document.   - DASH diet at home.  Labs today: BMP .   - Lengthy discussion with him today about taking medications as ordered and his current high risk for stroke -- offered a SW referral for therapy due to home stressors.  He refuses. - Will obtain renal ultrasound and echo to further assess.  Unsure if resistant vs poor medication adherence.  May need HTN clinic in future if ongoing elevations.      Relevant Medications   rosuvastatin  (CRESTOR ) 5 MG tablet   valsartan  (DIOVAN ) 320 MG tablet   spironolactone  (ALDACTONE ) 25 MG tablet   NIFEdipine  (PROCARDIA  XL/NIFEDICAL XL) 60 MG 24 hr tablet   Other Relevant Orders   Basic metabolic panel with GFR   ECHOCARDIOGRAM COMPLETE   VAS US  RENAL ARTERY DUPLEX     Respiratory   Centrilobular emphysema (HCC) - Primary   Chronic, noted on lung screening 02/10/23.  No current inhalers.  Will obtain spirometry at future visit when feeling better.  Recommend complete cessation of smoking.  Continue annual lung screening.  Inhalers as needed in future.  Check Alpha 1.      Relevant Orders   Alpha-1-Antitrypsin Deficiency   ECHOCARDIOGRAM COMPLETE     Other   Nicotine dependence, cigarettes, uncomplicated   I have recommended complete cessation of tobacco use. I have discussed various options available for assistance with tobacco cessation including over the counter methods (Nicotine gum, patch and lozenges). We also discussed prescription options (Chantix, Nicotine Inhaler / Nasal Spray). The patient is not interested in pursuing any prescription tobacco cessation options at this time.       Mixed hyperlipidemia   Chronic,  ongoing.  Continue current medication regimen and adjust as needed.  Lipid panel today.      Relevant Medications   rosuvastatin  (CRESTOR ) 5 MG tablet   valsartan  (DIOVAN ) 320 MG tablet   spironolactone  (ALDACTONE ) 25 MG tablet   NIFEdipine  (PROCARDIA  XL/NIFEDICAL XL) 60 MG 24 hr tablet   Other Relevant Orders   Lipid Panel w/o Chol/HDL Ratio   Hypokalemia   History of fluctuating levels since 2017 with lows occasionally presenting.  Recheck today.  Avoid medications that lower K+ levels.  Restart supplement as needed.  Currently takes Spironolactone  and Valsartan  which appear to  be helping levels.      Relevant Orders   Basic metabolic panel with GFR   Family history of systemic lupus erythematosus   Refer to + ANA plan of care.      Relevant Orders   Antiphospholipid Syndrome Comp   ANA positive   Noted on recent labs, speckled pattern 1:160 and 1:640 on checks. Sister with lupus.  ?lupus in this patient, does have BP that has been difficult to get under control.  Recommend he reschedule with rheumatology.  Will check antiphospholipid.  Discussed with patient.      Relevant Orders   ECHOCARDIOGRAM COMPLETE   VAS US  RENAL ARTERY DUPLEX      Follow up plan: Return in about 4 weeks (around 01/03/2024) for HTN.

## 2023-12-07 ENCOUNTER — Ambulatory Visit: Payer: Self-pay | Admitting: Nurse Practitioner

## 2023-12-07 DIAGNOSIS — E876 Hypokalemia: Secondary | ICD-10-CM

## 2023-12-07 MED ORDER — POTASSIUM CHLORIDE CRYS ER 10 MEQ PO TBCR: 10.0000 meq | EXTENDED_RELEASE_TABLET | Freq: Every day | ORAL | 0 refills | Status: DC

## 2023-12-11 NOTE — Progress Notes (Signed)
 Lab appt scheduled.

## 2023-12-23 LAB — ANTIPHOSPHOLIPID SYNDROME COMP
APTT: 27.1 s
Anticardiolipin Ab, IgA: <10 [APL'U]
Anticardiolipin Ab, IgG: <10 [GPL'U]
Anticardiolipin Ab, IgM: <10 [MPL'U]
Antiphosphatidylserine IgG: 0 {GPS'U}
Antiphosphatidylserine IgM: 0 {MPS'U}
Antiprothrombin Antibody, IgG: 1 G units
Beta-2 Glycoprotein I, IgA: <10 SAU
Beta-2 Glycoprotein I, IgG: <10 SGU
Beta-2 Glycoprotein I, IgM: <10 SMU
DRVVT Screen Seconds: 34.8 s
Hexagonal Phospholipid Neutral: 2 s
Platelet Neutralization: 1.7 s

## 2023-12-23 LAB — BASIC METABOLIC PANEL WITH GFR
BUN/Creatinine Ratio: 9 — ABNORMAL LOW (ref 10–24)
BUN: 8 mg/dL (ref 8–27)
CO2: 24 mmol/L (ref 20–29)
Calcium: 9.4 mg/dL (ref 8.6–10.2)
Chloride: 100 mmol/L (ref 96–106)
Creatinine, Ser: 0.94 mg/dL (ref 0.76–1.27)
Glucose: 97 mg/dL (ref 70–99)
Potassium: 3.1 mmol/L — ABNORMAL LOW (ref 3.5–5.2)
Sodium: 142 mmol/L (ref 134–144)
eGFR: 91 mL/min/{1.73_m2} (ref >59)

## 2023-12-23 LAB — ALPHA-1-ANTITRYPSIN DEFICIENCY

## 2023-12-23 LAB — LIPID PANEL W/O CHOL/HDL RATIO
Cholesterol, Total: 165 mg/dL (ref 100–199)
HDL: 61 mg/dL (ref >39)
LDL Chol Calc (NIH): 90 mg/dL (ref 0–99)
Triglycerides: 71 mg/dL (ref 0–149)
VLDL Cholesterol Cal: 14 mg/dL (ref 5–40)

## 2023-12-25 ENCOUNTER — Other Ambulatory Visit

## 2023-12-25 DIAGNOSIS — E876 Hypokalemia: Secondary | ICD-10-CM | POA: Diagnosis not present

## 2023-12-26 ENCOUNTER — Ambulatory Visit: Payer: Self-pay | Admitting: Nurse Practitioner

## 2023-12-26 LAB — BASIC METABOLIC PANEL WITH GFR
BUN/Creatinine Ratio: 13 (ref 10–24)
BUN: 13 mg/dL (ref 8–27)
CO2: 26 mmol/L (ref 20–29)
Calcium: 9.6 mg/dL (ref 8.6–10.2)
Chloride: 99 mmol/L (ref 96–106)
Creatinine, Ser: 1.03 mg/dL (ref 0.76–1.27)
Glucose: 104 mg/dL — ABNORMAL HIGH (ref 70–99)
Potassium: 3.4 mmol/L — ABNORMAL LOW (ref 3.5–5.2)
Sodium: 142 mmol/L (ref 134–144)
eGFR: 81 mL/min/{1.73_m2} (ref >59)

## 2023-12-26 MED ORDER — POTASSIUM CHLORIDE CRYS ER 10 MEQ PO TBCR: 10.0000 meq | EXTENDED_RELEASE_TABLET | Freq: Every day | ORAL | 0 refills | Status: AC

## 2024-01-04 ENCOUNTER — Encounter: Payer: Self-pay | Admitting: Nurse Practitioner

## 2024-01-04 ENCOUNTER — Ambulatory Visit (INDEPENDENT_AMBULATORY_CARE_PROVIDER_SITE_OTHER): Admitting: Nurse Practitioner

## 2024-01-04 VITALS — BP 136/80 | HR 65 | Temp 98.0°F | Ht 69.5 in | Wt 148.0 lb

## 2024-01-04 DIAGNOSIS — F17210 Nicotine dependence, cigarettes, uncomplicated: Secondary | ICD-10-CM | POA: Diagnosis not present

## 2024-01-04 DIAGNOSIS — I1A0 Resistant hypertension: Secondary | ICD-10-CM | POA: Diagnosis not present

## 2024-01-04 DIAGNOSIS — E876 Hypokalemia: Secondary | ICD-10-CM | POA: Diagnosis not present

## 2024-01-04 DIAGNOSIS — J432 Centrilobular emphysema: Secondary | ICD-10-CM

## 2024-01-04 MED ORDER — VALSARTAN 320 MG PO TABS: 320.0000 mg | ORAL_TABLET | Freq: Every day | ORAL | 4 refills | Status: AC

## 2024-01-04 MED ORDER — NIFEDIPINE ER OSMOTIC RELEASE 60 MG PO TB24: 60.0000 mg | ORAL_TABLET | Freq: Every day | ORAL | 4 refills | Status: AC

## 2024-01-04 NOTE — Patient Instructions (Signed)

## 2024-01-04 NOTE — Assessment & Plan Note (Signed)
 Chronic, noted on lung screening 02/10/23.  No current inhalers.  Will obtain spirometry at future visit.  Recommend complete cessation of smoking.  Continue annual lung screening.  Inhalers as needed in future.  Alpha Gal levels in past normal.

## 2024-01-04 NOTE — Progress Notes (Signed)
 BP 136/80 (BP Location: Left Arm, Patient Position: Sitting, Cuff Size: Normal)   Pulse 65   Temp 98 F (36.7 C) (Oral)   Ht 5' 9.5 (1.765 m)   Wt 148 lb (67.1 kg)   SpO2 97%   BMI 21.54 kg/m    Subjective:    Patient ID: Brian Walker, male    DOB: 1961-05-06, 63 y.o.   MRN: [id number]  HPI: Brian Walker is a 63 y.o. male  Chief Complaint  Patient presents with   Hypertension   HYPERTENSION / HYPERLIPIDEMIA To be taking Valsartan , Nifedipine , Spironolactone  (some mild gynecomastia with this), K+, and Rosuvastatin . 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  Reviewed with him last Nifedipine  fill in October for 90 days + Valsartan  in November for 90 days. Spironolactone  filled in February 2025.  Scheduled for echo on 01/30/24.  Amlodipine  in past caused fatigue and to feel ill. Taking K+ supplement due to history of low levels, even with current BP medications on board although not consistent with taking BP medications.  Main stressors remain his wife and life at home, but does have some stuff settled.   Continues to smoke, underlying emphysema.  Smokes 1/2 PPD, a little less. No alcohol or drug use.   Satisfied with current treatment? yes Duration of hypertension: chronic BP monitoring frequency: a few times a week BP range: 140-160/80 average BP medication side effects: no Duration of hyperlipidemia: chronic Cholesterol medication side effects: no Cholesterol supplements: none Medication compliance: good compliance Aspirin: no Recent stressors: yes at baseline but a little better Recurrent headaches: no Visual changes: no Palpitations: no Dyspnea: no Chest pain: no Lower extremity edema: no Dizzy/lightheaded: no   Relevant past medical, surgical, family and social history reviewed and updated as indicated.  Interim medical history since our last visit reviewed. Allergies and medications reviewed and updated.  Review of Systems  Constitutional:  Negative for activity change, diaphoresis, fatigue and fever.  Respiratory:  Negative for cough, chest tightness, shortness of breath and wheezing.   Cardiovascular:  Negative for chest pain, palpitations and leg swelling.  Gastrointestinal: Negative.   Neurological: Negative.   Psychiatric/Behavioral: Negative.     Per HPI unless specifically indicated above     Objective:    BP 136/80 (BP Location: Left Arm, Patient Position: Sitting, Cuff Size: Normal)   Pulse 65   Temp 98 F (36.7 C) (Oral)   Ht 5' 9.5 (1.765 m)   Wt 148 lb (67.1 kg)   SpO2 97%   BMI 21.54 kg/m   Wt Readings from Last 3 Encounters:  01/04/24 148 lb (67.1 kg)  12/06/23 147 lb (66.7 kg)  10/31/23 148 lb 3.2 oz (67.2 kg)    Physical Exam Vitals and nursing note reviewed.  Constitutional:      General: He is awake. He is not in acute distress.    Appearance: He is well-developed and well-groomed. He is not ill-appearing or toxic-appearing.  HENT:     Head: Normocephalic.     Right Ear: Hearing and external ear normal.     Left Ear: Hearing and external ear normal.   Eyes:     General: Lids are normal.     Extraocular Movements: Extraocular movements intact.     Conjunctiva/sclera: Conjunctivae normal.   Neck:     Thyroid : No  thyromegaly.     Vascular: No carotid bruit.   Cardiovascular:     Rate and Rhythm: Normal rate and regular rhythm.     Heart sounds: Normal heart sounds. No murmur heard.    No gallop.  Pulmonary:     Effort: No accessory muscle usage or respiratory distress.     Breath sounds: Normal breath sounds.  Abdominal:     General: Bowel sounds are normal. There is no distension.     Palpations: Abdomen is soft.     Tenderness: There is no abdominal tenderness.   Musculoskeletal:     Cervical back: Full passive range of motion without  pain.     Right lower leg: No edema.     Left lower leg: No edema.  Lymphadenopathy:     Cervical: No cervical adenopathy.   Skin:    General: Skin is warm.     Capillary Refill: Capillary refill takes less than 2 seconds.   Neurological:     Mental Status: He is alert and oriented to person, place, and time.     Deep Tendon Reflexes: Reflexes are normal and symmetric.     Reflex Scores:      Brachioradialis reflexes are 2+ on the right side and 2+ on the left side.      Patellar reflexes are 2+ on the right side and 2+ on the left side.  Psychiatric:        Attention and Perception: Attention normal.        Mood and Affect: Mood normal.        Speech: Speech normal.        Behavior: Behavior normal. Behavior is cooperative.        Thought Content: Thought content normal.    Results for orders placed or performed in visit on 12/25/23  Basic metabolic panel   Collection Time: 12/25/23  8:46 AM  Result Value Ref Range   Glucose 104 (H) 70 - 99 mg/dL   BUN 13 8 - 27 mg/dL   Creatinine, Ser 9.60 0.76 - 1.27 mg/dL   eGFR 81 >45 WU/JWJ/1.91   BUN/Creatinine Ratio 13 10 - 24   Sodium 142 134 - 144 mmol/L   Potassium 3.4 (L) 3.5 - 5.2 mmol/L   Chloride 99 96 - 106 mmol/L   CO2 26 20 - 29 mmol/L   Calcium  9.6 8.6 - 10.2 mg/dL      Assessment & Plan:   Problem List Items Addressed This Visit       Cardiovascular and Mediastinum   Resistant hypertension   Chronic, ongoing.  Reviewed medication at length with him and the fill dates last on these.  He does endorse not always taking Valsartan  and Nifedipine  consistently if traveling.  Will continue Valsartan , Nifedipine , and Spironolactone  at current doses. Prefer Spironolactone  due to his long history of fluctuating low K+ levels, concern for thiazide due to risk for lowering K+ levels. But if ongoing issues with breast tenderness with Spironolactone  may need to adjust regimen.  Had fatigue with Amlodipine  in past, but ?whether  this is related more to home stressors. - Had at length discussion with him about taking medications consistently and as ordered so we can get a true view of how his BP is doing with goal not to send to hypertension clinic.  He agrees with this plan.  Will continue to monitor adherence very closely and may involve [facility]. - Recommend he monitor BP at least a few mornings a  week at home and document.   - DASH diet at home.  Labs today: potassium .   - Will obtain echo to further assess.  He is scheduled for echo. Possible renal ultrasound in future.      Relevant Medications   NIFEdipine  (PROCARDIA  XL/NIFEDICAL XL) 60 MG 24 hr tablet   valsartan  (DIOVAN ) 320 MG tablet   Other Relevant Orders   Potassium     Respiratory   Centrilobular emphysema (HCC) - Primary   Chronic, noted on lung screening 02/10/23.  No current inhalers.  Will obtain spirometry at future visit.  Recommend complete cessation of smoking.  Continue annual lung screening.  Inhalers as needed in future.  Alpha Gal levels in past normal.        Other   Nicotine dependence, cigarettes, uncomplicated   I have recommended complete cessation of tobacco use. I have discussed various options available for assistance with tobacco cessation including over the counter methods (Nicotine gum, patch and lozenges). We also discussed prescription options (Chantix, Nicotine Inhaler / Nasal Spray). The patient is not interested in pursuing any prescription tobacco cessation options at this time.       Hypokalemia   History of fluctuating levels since 2017 with lows occasionally presenting.  Recheck today.  Avoid medications that lower K+ levels.  Currently takes Spironolactone  and Valsartan  which appear to be helping levels when taken consistently.      Relevant Orders   Potassium       Follow up plan: Return in about 3 months (around 04/05/2024) for HTN/HLD, COPD -- get spirometry.

## 2024-01-04 NOTE — Assessment & Plan Note (Signed)
 I have recommended complete cessation of tobacco use. I have discussed various options available for assistance with tobacco cessation including over the counter methods (Nicotine gum, patch and lozenges). We also discussed prescription options (Chantix, Nicotine Inhaler / Nasal Spray). The patient is not interested in pursuing any prescription tobacco cessation options at this time.

## 2024-01-04 NOTE — Assessment & Plan Note (Addendum)
 Chronic, ongoing.  Reviewed medication at length with him and the fill dates last on these.  He does endorse not always taking Valsartan  and Nifedipine  consistently if traveling.  Will continue Valsartan , Nifedipine , and Spironolactone  at current doses. Prefer Spironolactone  due to his long history of fluctuating low K+ levels, concern for thiazide due to risk for lowering K+ levels. But if ongoing issues with breast tenderness with Spironolactone  may need to adjust regimen.  Had fatigue with Amlodipine  in past, but ?whether this is related more to home stressors. - Had at length discussion with him about taking medications consistently and as ordered so we can get a true view of how his BP is doing with goal not to send to hypertension clinic.  He agrees with this plan.  Will continue to monitor adherence very closely and may involve [facility]. - Recommend he monitor BP at least a few mornings a week at home and document.   - DASH diet at home.  Labs today: potassium .   - Will obtain echo to further assess.  He is scheduled for echo. Possible renal ultrasound in future.

## 2024-01-04 NOTE — Assessment & Plan Note (Signed)
 History of fluctuating levels since 2017 with lows occasionally presenting.  Recheck today.  Avoid medications that lower K+ levels.  Currently takes Spironolactone  and Valsartan  which appear to be helping levels when taken consistently.

## 2024-01-05 ENCOUNTER — Ambulatory Visit: Payer: Self-pay | Admitting: Nurse Practitioner

## 2024-01-05 LAB — POTASSIUM: Potassium: 3.7 mmol/L (ref 3.5–5.2)

## 2024-01-05 NOTE — Progress Notes (Signed)
 Contacted via MyChart  Potassium level currently stable, continue all medications.

## 2024-01-30 ENCOUNTER — Ambulatory Visit: Admission: RE | Admit: 2024-01-30 | Source: Ambulatory Visit

## 2024-02-21 ENCOUNTER — Ambulatory Visit: Payer: Self-pay

## 2024-02-21 DIAGNOSIS — G231 Progressive supranuclear ophthalmoplegia [Steele-Richardson-Olszewski]: Secondary | ICD-10-CM | POA: Diagnosis not present

## 2024-02-21 DIAGNOSIS — F17210 Nicotine dependence, cigarettes, uncomplicated: Secondary | ICD-10-CM | POA: Diagnosis not present

## 2024-02-21 DIAGNOSIS — Z880 Allergy status to penicillin: Secondary | ICD-10-CM | POA: Diagnosis not present

## 2024-02-21 DIAGNOSIS — Z885 Allergy status to narcotic agent status: Secondary | ICD-10-CM | POA: Diagnosis not present

## 2024-02-21 DIAGNOSIS — I10 Essential (primary) hypertension: Secondary | ICD-10-CM | POA: Diagnosis not present

## 2024-02-21 DIAGNOSIS — G510 Bell's palsy: Secondary | ICD-10-CM | POA: Diagnosis not present

## 2024-02-21 NOTE — Telephone Encounter (Signed)
 Patient disconnected during transfer to triage. Attempted call back x 1, LVM, will attempt contact at a later time. Patient called right back and was connected to a triage nurse. See previous encounter.         Copied from CRM [id number]. Topic: Clinical - Red Word Triage >> Feb 21, 2024  4:34 PM Gennette ORN wrote: Red Word that prompted transfer to Nurse Triage: Patient whole faces feels different. On his right side he can't lift and it's numb. It's impacting his right eye for his vision. His words are slurring as well. He just noticed this less than 2 weeks ago. And it just got worst over the weeks. Answer Assessment - Initial Assessment Questions 1. SYMPTOM: What is the main symptom you are concerned about? (e.g., weakness, numbness)       2. ONSET: When did this start? (e.g., minutes, hours, days; while sleeping)       3. LAST NORMAL: When was the last time you (the patient) were normal (no symptoms)?       4. PATTERN Does this come and go, or has it been constant since it started?  Is it present now?       5. CARDIAC SYMPTOMS: Have you had any of the following symptoms: chest pain, difficulty breathing, palpitations?       6. NEUROLOGIC SYMPTOMS: Have you had any of the following symptoms: headache, dizziness, vision loss, double vision, changes in speech, unsteady on your feet?       7. OTHER SYMPTOMS: Do you have any other symptoms?  Protocols used: Neurologic Deficit-A-AH

## 2024-02-21 NOTE — Telephone Encounter (Signed)
 FYI Only or Action Required?: FYI only for provider. ^patient transferred to this RN after losing connection with previous RN Patient was last seen in primary care on 01/04/2024 by Cannady, Jolene T, NP.  Called Nurse Triage reporting Neurologic Problem.  Symptoms began several days ago.  Interventions attempted: Rest, hydration, or home remedies.  Symptoms are: unchanged.  Triage Disposition: Go to ED Now (or PCP Triage)  Patient/caregiver understands and will follow disposition?: Yes   Reason for Disposition  Patient sounds very sick or weak to the triager  Answer Assessment - Initial Assessment Questions 1. SYMPTOM: What is the main symptom you are concerned about? (e.g., weakness, numbness)     Right sided facial weakness 2. ONSET: When did this start? (e.g., minutes, hours, days; while sleeping)     Started yesterday 3. LAST NORMAL: When was the last time you (the patient) were normal (no symptoms)?     Less than 2 weeks ago but patient states he feels his last normal was yesterday 4. PATTERN Does this come and go, or has it been constant since it started?  Is it present now?     Constant-present right now 5. CARDIAC SYMPTOMS: Have you had any of the following symptoms: chest pain, difficulty breathing, palpitations?     no 6. NEUROLOGIC SYMPTOMS: Have you had any of the following symptoms: headache, dizziness, vision loss, double vision, changes in speech, unsteady on your feet?     Vision problems-patient concerned with right eye is not closing complete.  7. OTHER SYMPTOMS: Do you have any other symptoms?     No other symptoms.   Patient reports right side facial weakness. Patient states he has pronounced weakness to the right side. Patient states he is drooling from the right side. Patient states he has no control. Does feel that his vision is impacted. Patient recommended to the Emergency department  Protocols used: Neurologic [facility]

## 2024-02-22 NOTE — Telephone Encounter (Signed)
 Patient currently at ED. Will follow up after visit

## 2024-04-05 ENCOUNTER — Ambulatory Visit: Admitting: Nurse Practitioner

## 2024-05-15 ENCOUNTER — Encounter: Payer: Self-pay | Admitting: Nurse Practitioner
